# Patient Record
Sex: Female | Born: 1955 | Race: White | Hispanic: No | Marital: Married | Smoking: Never smoker
Health system: Southern US, Community
[De-identification: ages and names within clinical notes are randomized; demographics above are authoritative.]

## PROBLEM LIST (undated history)

## (undated) DIAGNOSIS — IMO0001 Reserved for inherently not codable concepts without codable children: Secondary | ICD-10-CM

## (undated) DIAGNOSIS — Z9889 Other specified postprocedural states: Secondary | ICD-10-CM

## (undated) DIAGNOSIS — K219 Gastro-esophageal reflux disease without esophagitis: Secondary | ICD-10-CM

## (undated) DIAGNOSIS — R112 Nausea with vomiting, unspecified: Secondary | ICD-10-CM

## (undated) HISTORY — PX: OTHER SURGICAL HISTORY: SHX169

---

## 2005-09-21 ENCOUNTER — Ambulatory Visit: Payer: Self-pay

## 2010-10-17 ENCOUNTER — Ambulatory Visit: Payer: Self-pay | Admitting: Internal Medicine

## 2010-11-01 ENCOUNTER — Ambulatory Visit: Payer: Self-pay | Admitting: Physician Assistant

## 2010-11-22 ENCOUNTER — Ambulatory Visit: Payer: Self-pay | Admitting: Orthopedic Surgery

## 2011-02-08 ENCOUNTER — Ambulatory Visit: Payer: Self-pay | Admitting: Orthopedic Surgery

## 2015-05-05 ENCOUNTER — Other Ambulatory Visit: Payer: Self-pay | Admitting: Orthopedic Surgery

## 2015-05-05 DIAGNOSIS — M25562 Pain in left knee: Secondary | ICD-10-CM

## 2015-05-11 ENCOUNTER — Encounter
Admission: RE | Disposition: A | Discharge status: Home / Self Care | Payer: Self-pay | Source: Ambulatory Visit | Attending: Orthopedic Surgery

## 2015-05-11 ENCOUNTER — Ambulatory Visit
Admission: RE | Admit: 2015-05-11 | Discharge: 2015-05-11 | Disposition: A | Discharge status: Home / Self Care | Payer: 59 | Source: Ambulatory Visit | Attending: Orthopedic Surgery | Admitting: Orthopedic Surgery

## 2015-05-11 ENCOUNTER — Encounter: Payer: Self-pay | Admitting: *Deleted

## 2015-05-11 ENCOUNTER — Ambulatory Visit: Payer: 59 | Admitting: Anesthesiology

## 2015-05-11 DIAGNOSIS — S83212A Bucket-handle tear of medial meniscus, current injury, left knee, initial encounter: Secondary | ICD-10-CM | POA: Diagnosis not present

## 2015-05-11 DIAGNOSIS — K219 Gastro-esophageal reflux disease without esophagitis: Secondary | ICD-10-CM | POA: Diagnosis not present

## 2015-05-11 DIAGNOSIS — X58XXXA Exposure to other specified factors, initial encounter: Secondary | ICD-10-CM | POA: Insufficient documentation

## 2015-05-11 DIAGNOSIS — Z79899 Other long term (current) drug therapy: Secondary | ICD-10-CM | POA: Insufficient documentation

## 2015-05-11 HISTORY — DX: Reserved for inherently not codable concepts without codable children: IMO0001

## 2015-05-11 HISTORY — DX: Gastro-esophageal reflux disease without esophagitis: K21.9

## 2015-05-11 HISTORY — PX: KNEE ARTHROSCOPY: SHX127

## 2015-05-11 SURGERY — ARTHROSCOPY, KNEE
Anesthesia: General | Site: Knee | Laterality: Left | Wound class: Clean

## 2015-05-11 MED ORDER — BUPIVACAINE-EPINEPHRINE (PF) 0.5% -1:200000 IJ SOLN
INTRAMUSCULAR | Status: DC | PRN
  Administered 2015-05-11: 30 mL

## 2015-05-11 MED ORDER — MIDAZOLAM HCL 2 MG/2ML IJ SOLN
INTRAMUSCULAR | Status: DC | PRN
  Administered 2015-05-11: 2 mg via INTRAVENOUS

## 2015-05-11 MED ORDER — ONDANSETRON HCL 4 MG/2ML IJ SOLN
INTRAMUSCULAR | Status: DC | PRN
  Administered 2015-05-11: 4 mg via INTRAVENOUS

## 2015-05-11 MED ORDER — ONDANSETRON HCL 4 MG/2ML IJ SOLN: 4.0000 mg | Freq: Once | INTRAMUSCULAR | Status: DC | PRN

## 2015-05-11 MED ORDER — FENTANYL CITRATE (PF) 100 MCG/2ML IJ SOLN
25.0000 ug | INTRAMUSCULAR | Status: DC | PRN
  Administered 2015-05-11 (×4): 25 ug via INTRAVENOUS

## 2015-05-11 MED ORDER — FENTANYL CITRATE (PF) 100 MCG/2ML IJ SOLN
INTRAMUSCULAR | Status: AC
  Filled 2015-05-11: qty 2

## 2015-05-11 MED ORDER — FENTANYL CITRATE (PF) 100 MCG/2ML IJ SOLN
INTRAMUSCULAR | Status: DC
Start: 2015-05-11 — End: 2015-05-11
  Filled 2015-05-11: qty 2

## 2015-05-11 MED ORDER — LIDOCAINE HCL (CARDIAC) 20 MG/ML IV SOLN
INTRAVENOUS | Status: DC | PRN
  Administered 2015-05-11: 100 mg via INTRAVENOUS

## 2015-05-11 MED ORDER — BUPIVACAINE-EPINEPHRINE (PF) 0.5% -1:200000 IJ SOLN
INTRAMUSCULAR | Status: AC
  Filled 2015-05-11: qty 30

## 2015-05-11 MED ORDER — FENTANYL CITRATE (PF) 100 MCG/2ML IJ SOLN
INTRAMUSCULAR | Status: DC | PRN
  Administered 2015-05-11: 100 ug via INTRAVENOUS

## 2015-05-11 MED ORDER — LACTATED RINGERS IV SOLN
INTRAVENOUS | Status: DC
  Administered 2015-05-11 (×2): via INTRAVENOUS

## 2015-05-11 MED ORDER — PROPOFOL 10 MG/ML IV BOLUS
INTRAVENOUS | Status: DC | PRN
  Administered 2015-05-11: 160 mg via INTRAVENOUS

## 2015-05-11 MED ORDER — HYDROCODONE-ACETAMINOPHEN 5-325 MG PO TABS: 1.0000 | ORAL_TABLET | Freq: Four times a day (QID) | ORAL | Status: DC | PRN

## 2015-05-11 SURGICAL SUPPLY — 25 items
BANDAGE ACE 4X5 VEL STRL LF (GAUZE/BANDAGES/DRESSINGS) ×2 IMPLANT
BANDAGE ELASTIC 4 LF NS (GAUZE/BANDAGES/DRESSINGS) ×2 IMPLANT
BLADE FULL RADIUS 3.5 (BLADE) IMPLANT
BLADE INCISOR PLUS 4.5 (BLADE) IMPLANT
BLADE SHAVER 4.5 DBL SERAT CV (CUTTER) IMPLANT
BLADE SHAVER 4.5X7 STR FR (MISCELLANEOUS) IMPLANT
CHLORAPREP W/TINT 26ML (MISCELLANEOUS) ×2 IMPLANT
CUTTER AGGRESSIVE+ 3.5 (CUTTER) ×2 IMPLANT
GAUZE SPONGE 4X4 12PLY STRL (GAUZE/BANDAGES/DRESSINGS) ×2 IMPLANT
GLOVE SURG ORTHO 9.0 STRL STRW (GLOVE) ×2 IMPLANT
GOWN STRL REUS W/ TWL LRG LVL3 (GOWN DISPOSABLE) ×1 IMPLANT
GOWN STRL REUS W/TWL LRG LVL3 (GOWN DISPOSABLE) ×1
GOWN SURG XXL (GOWNS) ×2 IMPLANT
IV LACTATED RINGER IRRG 3000ML (IV SOLUTION) ×4
IV LR IRRIG 3000ML ARTHROMATIC (IV SOLUTION) ×4 IMPLANT
KIT RM TURNOVER STRD PROC AR (KITS) ×2 IMPLANT
MANIFOLD NEPTUNE II (INSTRUMENTS) ×2 IMPLANT
PACK ARTHROSCOPY KNEE (MISCELLANEOUS) ×2 IMPLANT
SET TUBE SUCT SHAVER OUTFL 24K (TUBING) ×2 IMPLANT
SET TUBE TIP INTRA-ARTICULAR (MISCELLANEOUS) ×2 IMPLANT
SUT ETHILON 4-0 (SUTURE) ×1
SUT ETHILON 4-0 FS2 18XMFL BLK (SUTURE) ×1
SUTURE ETHLN 4-0 FS2 18XMF BLK (SUTURE) ×1 IMPLANT
TUBING ARTHRO INFLOW-ONLY STRL (TUBING) ×2 IMPLANT
WAND HAND CNTRL MULTIVAC 50 (MISCELLANEOUS) ×2 IMPLANT

## 2015-05-11 NOTE — Transfer of Care (Signed)
Immediate Anesthesia Transfer of Care Note  Patient: Michele ReamerCynthia Ridolfi  Procedure(s) Performed: Procedure(s): ARTHROSCOPY KNEE, PARTIAL MEDIAL MENISECTOMY (Left)  Patient Location: PACU  Anesthesia Type:General  Level of Consciousness: sedated  Airway & Oxygen Therapy: Patient Spontanous Breathing and Patient connected to face mask oxygen  Post-op Assessment: Report given to RN and Post -op Vital signs reviewed and stable  Post vital signs: Reviewed and stable  Last Vitals:  Filed Vitals:   05/11/15 1135  BP: 133/82  Pulse: 83  Temp: 36.8 C  Resp: 16    Complications: No apparent anesthesia complications

## 2015-05-11 NOTE — Anesthesia Procedure Notes (Signed)
Procedure Name: LMA Insertion Date/Time: 05/11/2015 12:51 PM Performed by: Junious SilkNOLES, Michele Boehm Pre-anesthesia Checklist: Patient identified, Patient being monitored, Timeout performed, Emergency Drugs available and Suction available Patient Re-evaluated:Patient Re-evaluated prior to inductionOxygen Delivery Method: Circle system utilized Preoxygenation: Pre-oxygenation with 100% oxygen Intubation Type: IV induction Ventilation: Mask ventilation without difficulty LMA: LMA inserted LMA Size: 3.5 Tube type: Oral Number of attempts: 1 Placement Confirmation: positive ETCO2 and breath sounds checked- equal and bilateral Tube secured with: Tape Dental Injury: Teeth and Oropharynx as per pre-operative assessment

## 2015-05-11 NOTE — Anesthesia Preprocedure Evaluation (Addendum)
Anesthesia Evaluation  Patient identified by MRN, date of birth, ID band Patient awake    Reviewed: Allergy & Precautions, NPO status , Patient's Chart, lab work & pertinent test results  Airway Mallampati: III  TM Distance: <3 FB Neck ROM: Full  Mouth opening: Limited Mouth Opening  Dental  (+) Missing, Poor Dentition   Pulmonary neg pulmonary ROS,    Pulmonary exam normal        Cardiovascular negative cardio ROS Normal cardiovascular exam     Neuro/Psych negative neurological ROS  negative psych ROS   GI/Hepatic Neg liver ROS, GERD  Medicated and Controlled,  Endo/Other  negative endocrine ROS  Renal/GU negative Renal ROS  negative genitourinary   Musculoskeletal negative musculoskeletal ROS (+)   Abdominal Normal abdominal exam  (+)   Peds negative pediatric ROS (+)  Hematology negative hematology ROS (+)   Anesthesia Other Findings   Reproductive/Obstetrics negative OB ROS                            Anesthesia Physical Anesthesia Plan  ASA: II  Anesthesia Plan: General   Post-op Pain Management:    Induction: Intravenous  Airway Management Planned: LMA  Additional Equipment:   Intra-op Plan:   Post-operative Plan: Extubation in OR  Informed Consent: I have reviewed the patients History and Physical, chart, labs and discussed the procedure including the risks, benefits and alternatives for the proposed anesthesia with the patient or authorized representative who has indicated his/her understanding and acceptance.   Dental advisory given  Plan Discussed with: CRNA and Surgeon  Anesthesia Plan Comments:         Anesthesia Quick Evaluation

## 2015-05-11 NOTE — Discharge Instructions (Addendum)
Leave dressing in place. Weightbearing as tolerated. Try to straighten leg. Aspirin 81 mg daily until walking normally, ice to the knee tonight.  General Anesthesia, Adult General anesthesia is a sleep-like state of non-feeling produced by medicines (anesthetics). General anesthesia prevents you from being alert and feeling pain during a medical procedure. Your caregiver may recommend general anesthesia if your procedure:  Is long.  Is painful or uncomfortable.  Would be frightening to see or hear.  Requires you to be still.  Affects your breathing.  Causes significant blood loss. LET YOUR CAREGIVER KNOW ABOUT:  Allergies to food or medicine.  Medicines taken, including vitamins, herbs, eyedrops, over-the-counter medicines, and creams.  Use of steroids (by mouth or creams).  Previous problems with anesthetics or numbing medicines, including problems experienced by relatives.  History of bleeding problems or blood clots.  Previous surgeries and types of anesthetics received.  Possibility of pregnancy, if this applies.  Use of cigarettes, alcohol, or illegal drugs.  Any health condition(s), especially diabetes, sleep apnea, and high blood pressure. RISKS AND COMPLICATIONS General anesthesia rarely causes complications. However, if complications do occur, they can be life threatening. Complications include:  A lung infection.  A stroke.  A heart attack.  Waking up during the procedure. When this occurs, the patient may be unable to move and communicate that he or she is awake. The patient may feel severe pain. Older adults and adults with serious medical problems are more likely to have complications than adults who are young and healthy. Some complications can be prevented by answering all of your caregiver's questions thoroughly and by following all pre-procedure instructions. It is important to tell your caregiver if any of the pre-procedure instructions, especially those  related to diet, were not followed. Any food or liquid in the stomach can cause problems when you are under general anesthesia. BEFORE THE PROCEDURE  Ask your caregiver if you will have to spend the night at the hospital. If you will not have to spend the night, arrange to have an adult drive you and stay with you for 24 hours.  Follow your caregiver's instructions if you are taking dietary supplements or medicines. Your caregiver may tell you to stop taking them or to reduce your dosage.  Do not smoke for as long as possible before your procedure. If possible, stop smoking 3-6 weeks before the procedure.  Do not take new dietary supplements or medicines within 1 week of your procedure unless your caregiver approves them.  Do not eat within 8 hours of your procedure or as directed by your caregiver. Drink only clear liquids, such as water, black coffee (without milk or cream), and fruit juices (without pulp).  Do not drink within 3 hours of your procedure or as directed by your caregiver.  You may brush your teeth on the morning of the procedure, but make sure to spit out the toothpaste and water when finished. PROCEDURE  You will receive anesthetics through a mask, through an intravenous (IV) access tube, or through both. A doctor who specializes in anesthesia (anesthesiologist) or a nurse who specializes in anesthesia (nurse anesthetist) or both will stay with you throughout the procedure to make sure you remain unconscious. He or she will also watch your blood pressure, pulse, and oxygen levels to make sure that the anesthetics do not cause any problems. Once you are asleep, a breathing tube or mask may be used to help you breathe. AFTER THE PROCEDURE You will wake up after the procedure  is complete. You may be in the room where the procedure was performed or in a recovery area. You may have a sore throat if a breathing tube was used. You may also  feel:  Dizzy.  Weak.  Drowsy.  Confused.  Nauseous.  Cold. These are all normal responses and can be expected to last for up to 24 hours after the procedure is complete. A caregiver will tell you when you are ready to go home. This will usually be when you are fully awake and in stable condition.   This information is not intended to replace advice given to you by your health care provider. Make sure you discuss any questions you have with your health care provider.   Document Released: 04/18/2007 Document Revised: 01/30/2014 Document Reviewed: 05/10/2011 Elsevier Interactive Patient Education [employer]2016 [employer]

## 2015-05-11 NOTE — H&P (Signed)
Reviewed paper H+P, will be scanned into chart. No changes noted.  

## 2015-05-12 ENCOUNTER — Encounter: Payer: Self-pay | Admitting: Orthopedic Surgery

## 2015-05-12 NOTE — Op Note (Signed)
05/11/2015  5:22 PM  PATIENT:  Michele Melton  60 y.o. female  PRE-OPERATIVE DIAGNOSIS:  medial meniscus left knee  POST-OPERATIVE DIAGNOSIS:  MEDIAL MENISCUS TEAR  PROCEDURE:  Procedure(s): ARTHROSCOPY KNEE, PARTIAL MEDIAL MENISECTOMY (Left)  SURGEON: Leitha SchullerMichael J Charniece Venturino, MD  ASSISTANTS: None  ANESTHESIA:   general  EBL:  Total I/O In: 1100 [P.O.:200; I.V.:900] Out: 5 [Blood:5]  BLOOD ADMINISTERED:none  DRAINS: none   LOCAL MEDICATIONS USED:  MARCAINE     SPECIMEN:  No Specimen  DISPOSITION OF SPECIMEN:  N/A  COUNTS:  YES  TOURNIQUET:    IMPLANTS: None  DICTATION: .Dragon Dictation patient was brought to the operating room and after adequate anesthesia was obtained the leg was placed in arthroscopic leg holder. After patient identification and timeout procedure completed after having prepped and draped the leg in inferior lateral portal was made. Initial inspection revealed mild patellofemoral degenerative change with normal tracking. There was a retained plica in the suprapatellar pouch which was debrided to make sure there is no loose body other potential causes of pain in the superior gutters. Coming around medially and inferior medial portal was made there is extensive degenerative change with chondral flaps present on the medial femoral condyle. There is some fissuring but no exposed bone. On probing there was a tear of the posterior horn of the medial meniscus involving the central third of the posterior third this was subsequently debrided with an ArthroCare wand and meniscal punch. The anterior cruciate ligament was intact and lateral compartment was essentially normal with just a few small areas of articular damage. The gutters were free of any loose body with mild synovitis. After addressing the medial meniscal pathology and removing the plica superiorly the knee was thoroughly irrigated until clear and all instrumentation withdrawn. Pre-and post procedure pictures have  been obtained. The wounds were closed with simple 4-0 nylon with 20 cc of half percent Sensorcaine infiltrated near the portals. Xeroform 4 x 4 web roll and Ace wrap applied  PLAN OF CARE: Discharge to home after PACU  PATIENT DISPOSITION:  PACU - hemodynamically stable.

## 2015-05-12 NOTE — Anesthesia Postprocedure Evaluation (Signed)
Anesthesia Post Note  Patient: Michele ReamerCynthia Melton  Procedure(s) Performed: Procedure(s) (LRB): ARTHROSCOPY KNEE, PARTIAL MEDIAL MENISECTOMY (Left)  Patient location during evaluation: PACU Anesthesia Type: General Level of consciousness: awake and alert and oriented Pain management: pain level controlled Vital Signs Assessment: post-procedure vital signs reviewed and stable Respiratory status: spontaneous breathing Cardiovascular status: blood pressure returned to baseline Anesthetic complications: no    Last Vitals:  Filed Vitals:   05/11/15 1430 05/11/15 1515  BP: 129/66 120/95  Pulse: 73 63  Temp: 36.5 C   Resp: 18 18    Last Pain:  Filed Vitals:   05/12/15 0817  PainSc: 0-No pain                 Lindwood Mogel

## 2015-05-14 ENCOUNTER — Ambulatory Visit: Payer: Self-pay

## 2018-02-26 ENCOUNTER — Other Ambulatory Visit: Payer: Self-pay

## 2018-02-26 ENCOUNTER — Encounter: Payer: Self-pay | Admitting: *Deleted

## 2018-02-27 ENCOUNTER — Encounter: Payer: Self-pay | Admitting: Anesthesiology

## 2018-02-27 NOTE — Discharge Instructions (Signed)

## 2018-03-06 ENCOUNTER — Ambulatory Visit
Admission: RE | Admit: 2018-03-06 | Discharge: 2018-03-06 | Disposition: A | Discharge status: Home / Self Care | Payer: 59 | Source: Ambulatory Visit | Attending: Ophthalmology | Admitting: Ophthalmology

## 2018-03-06 ENCOUNTER — Encounter
Admission: RE | Disposition: A | Discharge status: Home / Self Care | Payer: Self-pay | Source: Ambulatory Visit | Attending: Ophthalmology

## 2018-03-06 ENCOUNTER — Ambulatory Visit: Payer: 59 | Admitting: Anesthesiology

## 2018-03-06 DIAGNOSIS — H2512 Age-related nuclear cataract, left eye: Secondary | ICD-10-CM | POA: Diagnosis present

## 2018-03-06 DIAGNOSIS — K219 Gastro-esophageal reflux disease without esophagitis: Secondary | ICD-10-CM | POA: Insufficient documentation

## 2018-03-06 DIAGNOSIS — Z79899 Other long term (current) drug therapy: Secondary | ICD-10-CM | POA: Diagnosis not present

## 2018-03-06 DIAGNOSIS — M1990 Unspecified osteoarthritis, unspecified site: Secondary | ICD-10-CM | POA: Insufficient documentation

## 2018-03-06 HISTORY — DX: Nausea with vomiting, unspecified: R11.2

## 2018-03-06 HISTORY — DX: Other specified postprocedural states: Z98.890

## 2018-03-06 HISTORY — DX: Gastro-esophageal reflux disease without esophagitis: K21.9

## 2018-03-06 HISTORY — PX: CATARACT EXTRACTION W/PHACO: SHX586

## 2018-03-06 SURGERY — PHACOEMULSIFICATION, CATARACT, WITH IOL INSERTION
Anesthesia: Monitor Anesthesia Care | Site: Eye | Laterality: Right

## 2018-03-06 MED ORDER — MOXIFLOXACIN HCL 0.5 % OP SOLN
1.0000 [drp] | OPHTHALMIC | Status: DC | PRN
  Administered 2018-03-06 (×3): 1 [drp] via OPHTHALMIC

## 2018-03-06 MED ORDER — FENTANYL CITRATE (PF) 100 MCG/2ML IJ SOLN
INTRAMUSCULAR | Status: DC | PRN
  Administered 2018-03-06: 50 ug via INTRAVENOUS

## 2018-03-06 MED ORDER — ONDANSETRON HCL 4 MG/2ML IJ SOLN
INTRAMUSCULAR | Status: DC | PRN
  Administered 2018-03-06: 4 mg via INTRAVENOUS

## 2018-03-06 MED ORDER — EPINEPHRINE PF 1 MG/ML IJ SOLN
INTRAOCULAR | Status: DC | PRN
  Administered 2018-03-06: 69 mL via OPHTHALMIC

## 2018-03-06 MED ORDER — ARMC OPHTHALMIC DILATING DROPS
1.0000 "application " | OPHTHALMIC | Status: DC | PRN
  Administered 2018-03-06 (×3): 1 via OPHTHALMIC

## 2018-03-06 MED ORDER — CEFUROXIME OPHTHALMIC INJECTION 1 MG/0.1 ML
INJECTION | OPHTHALMIC | Status: DC | PRN
  Administered 2018-03-06: 0.1 mL via INTRACAMERAL

## 2018-03-06 MED ORDER — NA HYALUR & NA CHOND-NA HYALUR 0.4-0.35 ML IO KIT
PACK | INTRAOCULAR | Status: DC | PRN
  Administered 2018-03-06: 1 mL via INTRAOCULAR

## 2018-03-06 MED ORDER — BRIMONIDINE TARTRATE-TIMOLOL 0.2-0.5 % OP SOLN
OPHTHALMIC | Status: DC | PRN
  Administered 2018-03-06: 1 [drp] via OPHTHALMIC

## 2018-03-06 MED ORDER — LIDOCAINE HCL (PF) 2 % IJ SOLN
INTRAOCULAR | Status: DC | PRN
  Administered 2018-03-06: 1 mL

## 2018-03-06 MED ORDER — TETRACAINE HCL 0.5 % OP SOLN
1.0000 [drp] | OPHTHALMIC | Status: DC | PRN
  Administered 2018-03-06 (×3): 1 [drp] via OPHTHALMIC

## 2018-03-06 MED ORDER — MIDAZOLAM HCL 2 MG/2ML IJ SOLN
INTRAMUSCULAR | Status: DC | PRN
  Administered 2018-03-06 (×2): 1 mg via INTRAVENOUS

## 2018-03-06 MED ORDER — OXYCODONE HCL 5 MG/5ML PO SOLN: 5.0000 mg | Freq: Once | ORAL | Status: DC | PRN

## 2018-03-06 MED ORDER — OXYCODONE HCL 5 MG PO TABS: 5.0000 mg | ORAL_TABLET | Freq: Once | ORAL | Status: DC | PRN

## 2018-03-06 SURGICAL SUPPLY — 22 items
CANNULA ANT/CHMB 27G (MISCELLANEOUS) ×1 IMPLANT
CANNULA ANT/CHMB 27GA (MISCELLANEOUS) ×3 IMPLANT
GLOVE SURG LX 7.5 STRW (GLOVE) ×2
GLOVE SURG LX STRL 7.5 STRW (GLOVE) ×1 IMPLANT
GLOVE SURG TRIUMPH 8.0 PF LTX (GLOVE) ×3 IMPLANT
GOWN STRL REUS W/ TWL LRG LVL3 (GOWN DISPOSABLE) ×2 IMPLANT
GOWN STRL REUS W/TWL LRG LVL3 (GOWN DISPOSABLE) ×4
LENS IOL ACRSF IQ TRC 3 14.5 IMPLANT
LENS IOL ACRYSOF IQ TORIC 14.5 ×2 IMPLANT
LENS IOL IQ TORIC 3 14.5 ×1 IMPLANT
MARKER SKIN DUAL TIP RULER LAB (MISCELLANEOUS) ×3 IMPLANT
NDL FILTER BLUNT 18X1 1/2 (NEEDLE) ×1 IMPLANT
NEEDLE FILTER BLUNT 18X 1/2SAF (NEEDLE) ×2
NEEDLE FILTER BLUNT 18X1 1/2 (NEEDLE) ×1 IMPLANT
PACK CATARACT BRASINGTON (MISCELLANEOUS) ×3 IMPLANT
PACK EYE AFTER SURG (MISCELLANEOUS) ×3 IMPLANT
PACK OPTHALMIC (MISCELLANEOUS) ×3 IMPLANT
SYR 3ML LL SCALE MARK (SYRINGE) ×3 IMPLANT
SYR 5ML LL (SYRINGE) ×3 IMPLANT
SYR TB 1ML LUER SLIP (SYRINGE) ×3 IMPLANT
WATER STERILE IRR 500ML POUR (IV SOLUTION) ×3 IMPLANT
WIPE NON LINTING 3.25X3.25 (MISCELLANEOUS) ×3 IMPLANT

## 2018-03-06 NOTE — Op Note (Addendum)
LOCATION:  [facility]   PREOPERATIVE DIAGNOSIS:  Nuclear sclerotic cataract of the left eye.  H25.12  POSTOPERATIVE DIAGNOSIS:  Nuclear sclerotic cataract of the left eye.   PROCEDURE:  Phacoemulsification with Toric posterior chamber intraocular lens placement of the left eye.   LENS:  Implant Name Type Inv. Item Serial No. Manufacturer Lot No. LRB No. Used  AcrySof IQ Toric astigmatism iol Intraocular Lens  [id number]   Right 1   SN6aT3 14.5D Toric intraocular lens with 1.5 diopters of cylindrical power with axis orientation at 94 degrees.   ULTRASOUND TIME: 14 % of 0 minutes, 56 seconds.  CDE 8.2   SURGEON:  Deirdre Evener, MD   ANESTHESIA:  Topical with tetracaine drops and 2% Xylocaine jelly, augmented with 1% preservative-free intracameral lidocaine.  COMPLICATIONS:  None.   DESCRIPTION OF PROCEDURE:  The patient was identified in the holding room and transported to the operating suite and placed in the supine position under the operating microscope.  The left eye was identified as the operative eye, and it was prepped and draped in the usual sterile ophthalmic fashion.    A clear-corneal paracentesis incision was made at the 1:30 position.  0.5 ml of preservative-free 1% lidocaine was injected into the anterior chamber. The anterior chamber was filled with Viscoat.  A 2.4 millimeter near clear corneal incision was then made at the 10:30 position.  A cystotome and capsulorrhexis forceps were then used to make a curvilinear capsulorrhexis.  Hydrodissection and hydrodelineation were then performed using balanced salt solution.   Phacoemulsification was then used in stop and chop fashion to remove the lens, nucleus and epinucleus.  The remaining cortex was aspirated using the irrigation and aspiration handpiece.  Provisc viscoelastic was then placed into the capsular bag to distend it for lens placement.  The Verion digital marker was used to align the implant at the  intended axis.   A 14.5 diopter lens was then injected into the capsular bag.  It was rotated clockwise until the axis marks on the lens were approximately 15 degrees in the counterclockwise direction to the intended alignment.  The viscoelastic was aspirated from the eye using the irrigation aspiration handpiece.  Then, a Koch spatula through the sideport incision was used to rotate the lens in a clockwise direction until the axis markings of the intraocular lens were lined up with the Verion alignment.  Balanced salt solution was then used to hydrate the wounds. Cefuroxime 0.1 ml of a 10mg /ml solution was injected into the anterior chamber for a dose of 1 mg of intracameral antibiotic at the completion of the case.    The eye was noted to have a physiologic pressure and there was no wound leak noted.   Timolol and Brimonidine drops were applied to the eye.  The patient was taken to the recovery room in stable condition having had no complications of anesthesia or surgery.  Michele Melton 03/06/2018, 9:42 AM

## 2018-03-06 NOTE — Anesthesia Postprocedure Evaluation (Signed)
Anesthesia Post Note  Patient: Michele Melton  Procedure(s) Performed: CATARACT EXTRACTION PHACO AND INTRAOCULAR LENS PLACEMENT ([place]) RIGHT (Right Eye)  Patient location during evaluation: PACU Anesthesia Type: MAC Level of consciousness: awake and alert Pain management: pain level controlled Vital Signs Assessment: post-procedure vital signs reviewed and stable Respiratory status: spontaneous breathing, nonlabored ventilation, respiratory function stable and patient connected to nasal cannula oxygen Cardiovascular status: stable and blood pressure returned to baseline Postop Assessment: no apparent nausea or vomiting Anesthetic complications: no    Antoinett Dorman

## 2018-03-06 NOTE — Transfer of Care (Signed)
Immediate Anesthesia Transfer of Care Note  Patient: Michele Melton  Procedure(s) Performed: CATARACT EXTRACTION PHACO AND INTRAOCULAR LENS PLACEMENT ([facility]) RIGHT (Right Eye)  Patient Location: PACU  Anesthesia Type: MAC  Level of Consciousness: awake, alert  and patient cooperative  Airway and Oxygen Therapy: Patient Spontanous Breathing and Patient connected to supplemental oxygen  Post-op Assessment: Post-op Vital signs reviewed, Patient's Cardiovascular Status Stable, Respiratory Function Stable, Patent Airway and No signs of Nausea or vomiting  Post-op Vital Signs: Reviewed and stable  Complications: No apparent anesthesia complications

## 2018-03-06 NOTE — Anesthesia Preprocedure Evaluation (Signed)
Anesthesia Evaluation  Patient identified by MRN, date of birth, ID band  Reviewed: NPO status   History of Anesthesia Complications (+) PONV and history of anesthetic complications  Airway Mallampati: II  TM Distance: >3 FB Neck ROM: full    Dental  (+) Missing,    Pulmonary neg pulmonary ROS,    Pulmonary exam normal        Cardiovascular Exercise Tolerance: Good negative cardio ROS Normal cardiovascular exam     Neuro/Psych negative neurological ROS  negative psych ROS   GI/Hepatic Neg liver ROS, GERD  Controlled,  Endo/Other  negative endocrine ROS  Renal/GU negative Renal ROS  negative genitourinary   Musculoskeletal   Abdominal   Peds  Hematology negative hematology ROS (+)   Anesthesia Other Findings   Reproductive/Obstetrics                             Anesthesia Physical Anesthesia Plan  ASA: II  Anesthesia Plan: MAC   Post-op Pain Management:    Induction:   PONV Risk Score and Plan:   Airway Management Planned:   Additional Equipment:   Intra-op Plan:   Post-operative Plan:   Informed Consent: I have reviewed the patients History and Physical, chart, labs and discussed the procedure including the risks, benefits and alternatives for the proposed anesthesia with the patient or authorized representative who has indicated his/her understanding and acceptance.       Plan Discussed with: CRNA  Anesthesia Plan Comments:         Anesthesia Quick Evaluation

## 2018-03-06 NOTE — H&P (Signed)

## 2018-03-07 ENCOUNTER — Encounter: Payer: Self-pay | Admitting: Ophthalmology

## 2018-03-18 ENCOUNTER — Encounter: Payer: Self-pay | Admitting: *Deleted

## 2018-03-20 ENCOUNTER — Ambulatory Visit: Payer: 59 | Admitting: Anesthesiology

## 2018-03-20 ENCOUNTER — Encounter
Admission: RE | Disposition: A | Discharge status: Home / Self Care | Payer: Self-pay | Source: Home / Self Care | Attending: Ophthalmology

## 2018-03-20 ENCOUNTER — Ambulatory Visit
Admission: RE | Admit: 2018-03-20 | Discharge: 2018-03-20 | Disposition: A | Discharge status: Home / Self Care | Payer: 59 | Attending: Ophthalmology | Admitting: Ophthalmology

## 2018-03-20 DIAGNOSIS — H2512 Age-related nuclear cataract, left eye: Secondary | ICD-10-CM | POA: Insufficient documentation

## 2018-03-20 DIAGNOSIS — Z9841 Cataract extraction status, right eye: Secondary | ICD-10-CM | POA: Diagnosis not present

## 2018-03-20 DIAGNOSIS — K219 Gastro-esophageal reflux disease without esophagitis: Secondary | ICD-10-CM | POA: Insufficient documentation

## 2018-03-20 HISTORY — PX: CATARACT EXTRACTION W/PHACO: SHX586

## 2018-03-20 SURGERY — PHACOEMULSIFICATION, CATARACT, WITH IOL INSERTION
Anesthesia: Monitor Anesthesia Care | Site: Eye | Laterality: Left

## 2018-03-20 MED ORDER — CARBACHOL 0.01 % IO SOLN
INTRAOCULAR | Status: DC | PRN
  Administered 2018-03-20: .5 mL via INTRAOCULAR

## 2018-03-20 MED ORDER — MOXIFLOXACIN HCL 0.5 % OP SOLN
1.0000 [drp] | OPHTHALMIC | Status: DC | PRN
  Administered 2018-03-20: 1 [drp] via OPHTHALMIC

## 2018-03-20 MED ORDER — POVIDONE-IODINE 5 % OP SOLN
OPHTHALMIC | Status: DC | PRN
  Administered 2018-03-20: 1 via OPHTHALMIC

## 2018-03-20 MED ORDER — FENTANYL CITRATE (PF) 100 MCG/2ML IJ SOLN
INTRAMUSCULAR | Status: DC | PRN
  Administered 2018-03-20 (×2): 50 ug via INTRAVENOUS

## 2018-03-20 MED ORDER — LIDOCAINE HCL (PF) 4 % IJ SOLN
INTRAOCULAR | Status: DC | PRN
  Administered 2018-03-20: 2 mL via OPHTHALMIC

## 2018-03-20 MED ORDER — FENTANYL CITRATE (PF) 100 MCG/2ML IJ SOLN
INTRAMUSCULAR | Status: AC
  Filled 2018-03-20: qty 2

## 2018-03-20 MED ORDER — LIDOCAINE HCL (PF) 4 % IJ SOLN
INTRAMUSCULAR | Status: AC
  Filled 2018-03-20: qty 5

## 2018-03-20 MED ORDER — EPINEPHRINE PF 1 MG/ML IJ SOLN
INTRAMUSCULAR | Status: AC
  Filled 2018-03-20: qty 1

## 2018-03-20 MED ORDER — NEOMYCIN-POLYMYXIN-DEXAMETH 0.1 % OP OINT
TOPICAL_OINTMENT | OPHTHALMIC | Status: DC | PRN
  Administered 2018-03-20: 1 via OPHTHALMIC

## 2018-03-20 MED ORDER — ARMC OPHTHALMIC DILATING DROPS
1.0000 "application " | OPHTHALMIC | Status: AC
  Administered 2018-03-20 (×3): 1 via OPHTHALMIC

## 2018-03-20 MED ORDER — NEOMYCIN-POLYMYXIN-DEXAMETH 3.5-10000-0.1 OP OINT
TOPICAL_OINTMENT | OPHTHALMIC | Status: AC
  Filled 2018-03-20: qty 3.5

## 2018-03-20 MED ORDER — TETRACAINE HCL 0.5 % OP SOLN
1.0000 [drp] | OPHTHALMIC | Status: AC | PRN
  Administered 2018-03-20 (×2): 1 [drp] via OPHTHALMIC

## 2018-03-20 MED ORDER — ARMC OPHTHALMIC DILATING DROPS
OPHTHALMIC | Status: AC
  Administered 2018-03-20: 1 via OPHTHALMIC
  Filled 2018-03-20: qty 0.5

## 2018-03-20 MED ORDER — NA HYALUR & NA CHOND-NA HYALUR 0.4-0.35 ML IO KIT
PACK | INTRAOCULAR | Status: DC | PRN
  Administered 2018-03-20: 1 mL via INTRAOCULAR

## 2018-03-20 MED ORDER — POVIDONE-IODINE 5 % OP SOLN
OPHTHALMIC | Status: AC
  Filled 2018-03-20: qty 30

## 2018-03-20 MED ORDER — MOXIFLOXACIN HCL 0.5 % OP SOLN
OPHTHALMIC | Status: AC
  Filled 2018-03-20: qty 6

## 2018-03-20 MED ORDER — SODIUM CHLORIDE 0.9 % IV SOLN
INTRAVENOUS | Status: DC
  Administered 2018-03-20: 08:00:00 via INTRAVENOUS

## 2018-03-20 MED ORDER — TETRACAINE HCL 0.5 % OP SOLN
OPHTHALMIC | Status: AC
  Administered 2018-03-20: 1 [drp] via OPHTHALMIC
  Filled 2018-03-20: qty 4

## 2018-03-20 MED ORDER — MIDAZOLAM HCL 2 MG/2ML IJ SOLN
INTRAMUSCULAR | Status: DC | PRN
  Administered 2018-03-20 (×2): 1 mg via INTRAVENOUS

## 2018-03-20 MED ORDER — EPINEPHRINE PF 1 MG/ML IJ SOLN
INTRAOCULAR | Status: DC | PRN
  Administered 2018-03-20: 1 mL via OPHTHALMIC

## 2018-03-20 MED ORDER — NA HYALUR & NA CHOND-NA HYALUR 0.55-0.5 ML IO KIT
PACK | INTRAOCULAR | Status: AC
  Filled 2018-03-20: qty 1.05

## 2018-03-20 MED ORDER — MIDAZOLAM HCL 2 MG/2ML IJ SOLN
INTRAMUSCULAR | Status: AC
  Filled 2018-03-20: qty 2

## 2018-03-20 MED ORDER — MOXIFLOXACIN HCL 0.5 % OP SOLN
1.0000 [drp] | OPHTHALMIC | Status: AC
  Administered 2018-03-20 (×2): 1 [drp] via OPHTHALMIC

## 2018-03-20 SURGICAL SUPPLY — 20 items
GLOVE BIO SURGEON STRL SZ8 (GLOVE) ×3 IMPLANT
GLOVE BIOGEL M 6.5 STRL (GLOVE) ×3 IMPLANT
GLOVE SURG LX 7.5 STRW (GLOVE) ×2
GLOVE SURG LX STRL 7.5 STRW (GLOVE) ×1 IMPLANT
GOWN STRL REUS W/ TWL LRG LVL3 (GOWN DISPOSABLE) ×2 IMPLANT
GOWN STRL REUS W/TWL LRG LVL3 (GOWN DISPOSABLE) ×4
LABEL CATARACT MEDS ST (LABEL) ×3 IMPLANT
LENS IOL ACRSF IQ TRC 7 13.5 IMPLANT
LENS IOL ACRYSOF IQ TORIC 13.5 ×2 IMPLANT
LENS IOL IQ TORIC 7 13.5 ×1 IMPLANT
NDL HPO THNWL 1X22GA REG BVL (NEEDLE) ×1 IMPLANT
NEEDLE SAFETY 22GX1 (NEEDLE) ×2
PACK CATARACT (MISCELLANEOUS) ×3 IMPLANT
PACK CATARACT BRASINGTON LX (MISCELLANEOUS) ×3 IMPLANT
PACK EYE AFTER SURG (MISCELLANEOUS) ×3 IMPLANT
SOL BSS BAG (MISCELLANEOUS) ×3
SOLUTION BSS BAG (MISCELLANEOUS) ×1 IMPLANT
SYR 5ML LL (SYRINGE) ×3 IMPLANT
WATER STERILE IRR 250ML POUR (IV SOLUTION) ×3 IMPLANT
WIPE NON LINTING 3.25X3.25 (MISCELLANEOUS) ×3 IMPLANT

## 2018-03-20 NOTE — Op Note (Signed)
LOCATION:  [facility]   PREOPERATIVE DIAGNOSIS:  Nuclear sclerotic cataract of the left eye.  H25.12  POSTOPERATIVE DIAGNOSIS:  Nuclear sclerotic cataract of the left eye.   PROCEDURE:  Phacoemulsification with Toric posterior chamber intraocular lens placement of the left eye.   LENS:  Implant Name Type Inv. Item Serial No. Manufacturer Lot No. LRB No. Used  alcon acrysof toric lens   [id number]   Left 1   SN6AT7 13.5 DToric intraocular lens with 4.5 diopters of cylindrical power with axis orientation at 64 degrees.   ULTRASOUND TIME: 12 % of 1 minutes, 3 seconds.  CDE 9.5   SURGEON:  Deirdre Evener, MD   ANESTHESIA:  Topical with tetracaine drops and 2% Xylocaine jelly, augmented with 1% preservative-free intracameral lidocaine.  COMPLICATIONS:  None.   DESCRIPTION OF PROCEDURE:  The patient was identified in the holding room and transported to the operating suite and placed in the supine position under the operating microscope.  The left eye was identified as the operative eye, and it was prepped and draped in the usual sterile ophthalmic fashion.    A clear-corneal paracentesis incision was made at the 1:30 position.  0.5 ml of preservative-free 1% lidocaine was injected into the anterior chamber. The anterior chamber was filled with Viscoat.  A 2.4 millimeter near clear corneal incision was then made at the 10:30 position.  A cystotome and capsulorrhexis forceps were then used to make a curvilinear capsulorrhexis.  Hydrodissection and hydrodelineation were then performed using balanced salt solution.   Phacoemulsification was then used in stop and chop fashion to remove the lens, nucleus and epinucleus.  The remaining cortex was aspirated using the irrigation and aspiration handpiece.  Provisc viscoelastic was then placed into the capsular bag to distend it for lens placement.  The Verion digital marker was used to align the implant at the intended axis.   A 13.5  diopter lens was then injected into the capsular bag.  It was rotated clockwise until the axis marks on the lens were approximately 15 degrees in the counterclockwise direction to the intended alignment.  The viscoelastic was aspirated from the eye using the irrigation aspiration handpiece.  Then, a Koch spatula through the sideport incision was used to rotate the lens in a clockwise direction until the axis markings of the intraocular lens were lined up with the Verion alignment.  Balanced salt solution was then used to hydrate the wounds. Vigamox 0.2 ml of a 1mg  per ml solution was injected into the anterior chamber for a dose of 0.2 mg of intracameral antibiotic at the completion of the case.    The eye was noted to have a physiologic pressure and there was no wound leak noted.   Timolol and Brimonidine drops were applied to the eye.  The patient was taken to the recovery room in stable condition having had no complications of anesthesia or surgery.  Vieno Tarrant 03/20/2018, 9:15 AM

## 2018-03-20 NOTE — Anesthesia Preprocedure Evaluation (Signed)
Anesthesia Evaluation  Patient identified by MRN, date of birth, ID band Patient awake    Reviewed: Allergy & Precautions, NPO status , Patient's Chart, lab work & pertinent test results  History of Anesthesia Complications (+) PONV and history of anesthetic complications  Airway Mallampati: II  TM Distance: >3 FB Neck ROM: Full    Dental no notable dental hx.    Pulmonary neg pulmonary ROS, neg sleep apnea, neg COPD,    breath sounds clear to auscultation- rhonchi (-) wheezing      Cardiovascular Exercise Tolerance: Good (-) hypertension(-) CAD, (-) Past MI, (-) Cardiac Stents and (-) CABG  Rhythm:Regular Rate:Normal - Systolic murmurs and - Diastolic murmurs    Neuro/Psych neg Seizures negative neurological ROS  negative psych ROS   GI/Hepatic Neg liver ROS, GERD  ,  Endo/Other  negative endocrine ROSneg diabetes  Renal/GU negative Renal ROS     Musculoskeletal negative musculoskeletal ROS (+)   Abdominal (+) - obese,   Peds  Hematology negative hematology ROS (+)   Anesthesia Other Findings Past Medical History: No date: GERD (gastroesophageal reflux disease) No date: PONV (postoperative nausea and vomiting)     Comment:  after 1st knee surgery No date: Reflux   Reproductive/Obstetrics                             Anesthesia Physical Anesthesia Plan  ASA: II  Anesthesia Plan: MAC   Post-op Pain Management:    Induction: Intravenous  PONV Risk Score and Plan: 3 and Midazolam  Airway Management Planned: Natural Airway  Additional Equipment:   Intra-op Plan:   Post-operative Plan:   Informed Consent: I have reviewed the patients History and Physical, chart, labs and discussed the procedure including the risks, benefits and alternatives for the proposed anesthesia with the patient or authorized representative who has indicated his/her understanding and acceptance.        Plan Discussed with: CRNA and Anesthesiologist  Anesthesia Plan Comments:         Anesthesia Quick Evaluation

## 2018-03-20 NOTE — H&P (Signed)

## 2018-03-20 NOTE — Anesthesia Postprocedure Evaluation (Signed)
Anesthesia Post Note  Patient: Michele Melton  Procedure(s) Performed: CATARACT EXTRACTION PHACO AND INTRAOCULAR LENS PLACEMENT ([facility])-LEFT (Left Eye)  Patient location during evaluation: PACU Anesthesia Type: MAC Level of consciousness: awake and alert and oriented Pain management: pain level controlled Vital Signs Assessment: post-procedure vital signs reviewed and stable Respiratory status: spontaneous breathing, nonlabored ventilation and respiratory function stable Cardiovascular status: blood pressure returned to baseline and stable Postop Assessment: no signs of nausea or vomiting Anesthetic complications: no     Last Vitals:  Vitals:   03/20/18 0751 03/20/18 0917  BP: 127/62 114/76  Pulse:  73  Resp:  16  Temp:  36.8 C  SpO2:  98%    Last Pain:  Vitals:   03/20/18 0917  TempSrc: Oral  PainSc: 0-No pain                 Daymond Cordts

## 2018-03-20 NOTE — Anesthesia Post-op Follow-up Note (Signed)
Anesthesia QCDR form completed.        

## 2018-03-20 NOTE — Transfer of Care (Signed)
Immediate Anesthesia Transfer of Care Note  Patient: Michele Melton  Procedure(s) Performed: CATARACT EXTRACTION PHACO AND INTRAOCULAR LENS PLACEMENT ([facility])-LEFT (Left Eye)  Patient Location: PACU and Short Stay  Anesthesia Type:MAC  Level of Consciousness: awake, alert  and oriented  Airway & Oxygen Therapy: Patient Spontanous Breathing  Post-op Assessment: Report given to RN and Post -op Vital signs reviewed and stable  Post vital signs: Reviewed and stable  Last Vitals:  Vitals Value Taken Time  BP    Temp    Pulse    Resp    SpO2      Last Pain:  Vitals:   03/20/18 0718  TempSrc: Tympanic         Complications: No apparent anesthesia complications

## 2018-03-20 NOTE — Discharge Instructions (Addendum)
Eye Surgery Discharge Instructions  Expect mild scratchy sensation or mild soreness. DO NOT RUB YOUR EYE!  The day of surgery:  Minimal physical activity, but bed rest is not required  No reading, computer work, or close hand work  No bending, lifting, or straining.  May watch TV  For 24 hours:  No driving, legal decisions, or alcoholic beverages  Safety precautions  Eat anything you prefer: It is better to start with liquids, then soup then solid foods.  Solar shield eyeglasses should be worn for comfort in the sunlight/patch while sleeping  Resume all regular medications including aspirin or Coumadin if these were discontinued prior to surgery. You may shower, bathe, shave, or wash your hair. Tylenol may be taken for mild discomfort. Follow Dr. Skip Estimable eye drop instruction sheet as reviewed.  Call your doctor if you experience significant pain, nausea, or vomiting, fever > 101 or other signs of infection. [phone] or [phone] Specific instructions:  Follow-up Information    Lockie Mola, MD Follow up.   Specialty:  Ophthalmology Why:  03/21/18 @ 9:40 am Contact information: [address]   [place] [address] [phone]

## 2018-08-12 ENCOUNTER — Encounter: Payer: Self-pay | Admitting: Ophthalmology

## 2020-10-04 ENCOUNTER — Emergency Department: Payer: POS

## 2020-10-04 ENCOUNTER — Other Ambulatory Visit: Payer: Self-pay

## 2020-10-04 ENCOUNTER — Emergency Department
Admission: EM | Admit: 2020-10-04 | Discharge: 2020-10-04 | Disposition: A | Discharge status: Home / Self Care | Payer: POS | Attending: Emergency Medicine | Admitting: Emergency Medicine

## 2020-10-04 DIAGNOSIS — M5412 Radiculopathy, cervical region: Secondary | ICD-10-CM | POA: Insufficient documentation

## 2020-10-04 DIAGNOSIS — R29818 Other symptoms and signs involving the nervous system: Secondary | ICD-10-CM | POA: Diagnosis not present

## 2020-10-04 DIAGNOSIS — G589 Mononeuropathy, unspecified: Secondary | ICD-10-CM

## 2020-10-04 DIAGNOSIS — M542 Cervicalgia: Secondary | ICD-10-CM | POA: Diagnosis present

## 2020-10-04 DIAGNOSIS — R1010 Upper abdominal pain, unspecified: Secondary | ICD-10-CM | POA: Diagnosis not present

## 2020-10-04 DIAGNOSIS — R0789 Other chest pain: Secondary | ICD-10-CM | POA: Insufficient documentation

## 2020-10-04 DIAGNOSIS — M79602 Pain in left arm: Secondary | ICD-10-CM

## 2020-10-04 DIAGNOSIS — K219 Gastro-esophageal reflux disease without esophagitis: Secondary | ICD-10-CM | POA: Diagnosis not present

## 2020-10-04 LAB — BASIC METABOLIC PANEL
Anion gap: 9 (ref 5–15)
BUN: 20 mg/dL (ref 8–23)
CO2: 25 mmol/L (ref 22–32)
Calcium: 9 mg/dL (ref 8.9–10.3)
Chloride: 104 mmol/L (ref 98–111)
Creatinine, Ser: 1.07 mg/dL — ABNORMAL HIGH (ref 0.44–1.00)
GFR, Estimated: 58 mL/min — ABNORMAL LOW (ref >60)
Glucose, Bld: 113 mg/dL — ABNORMAL HIGH (ref 70–99)
Potassium: 4.1 mmol/L (ref 3.5–5.1)
Sodium: 138 mmol/L (ref 135–145)

## 2020-10-04 LAB — HEPATIC FUNCTION PANEL
ALT: 18 U/L (ref 0–44)
AST: 20 U/L (ref 15–41)
Albumin: 4.4 g/dL (ref 3.5–5.0)
Alkaline Phosphatase: 94 U/L (ref 38–126)
Bilirubin, Direct: 0.2 mg/dL (ref 0.0–0.2)
Indirect Bilirubin: 1.2 mg/dL — ABNORMAL HIGH (ref 0.3–0.9)
Total Bilirubin: 1.4 mg/dL — ABNORMAL HIGH (ref 0.3–1.2)
Total Protein: 7.8 g/dL (ref 6.5–8.1)

## 2020-10-04 LAB — CBC
HCT: 41.8 % (ref 36.0–46.0)
Hemoglobin: 14.6 g/dL (ref 12.0–15.0)
MCH: 31.8 pg (ref 26.0–34.0)
MCHC: 34.9 g/dL (ref 30.0–36.0)
MCV: 91.1 fL (ref 80.0–100.0)
Platelets: 264 10*3/uL (ref 150–400)
RBC: 4.59 MIL/uL (ref 3.87–5.11)
RDW: 12.6 % (ref 11.5–15.5)
WBC: 6.8 10*3/uL (ref 4.0–10.5)
nRBC: 0 % (ref 0.0–0.2)

## 2020-10-04 LAB — TROPONIN I (HIGH SENSITIVITY): Troponin I (High Sensitivity): 4 ng/L (ref <18)

## 2020-10-04 LAB — LIPASE, BLOOD: Lipase: 47 U/L (ref 11–51)

## 2020-10-04 MED ORDER — IOHEXOL 350 MG/ML SOLN
100.0000 mL | Freq: Once | INTRAVENOUS | Status: AC | PRN
  Administered 2020-10-04: 100 mL via INTRAVENOUS

## 2020-10-04 MED ORDER — LIDOCAINE 5 % EX PTCH: 1.0000 | MEDICATED_PATCH | Freq: Two times a day (BID) | CUTANEOUS | 0 refills | Status: AC

## 2020-10-04 MED ORDER — ACETAMINOPHEN 500 MG PO TABS
1000.0000 mg | ORAL_TABLET | Freq: Once | ORAL | Status: AC
  Administered 2020-10-04: 1000 mg via ORAL
  Filled 2020-10-04: qty 2

## 2020-10-04 MED ORDER — IBUPROFEN 400 MG PO TABS: 400.0000 mg | ORAL_TABLET | Freq: Three times a day (TID) | ORAL | 0 refills | Status: AC | PRN

## 2020-10-04 MED ORDER — LIDOCAINE 5 % EX PTCH
1.0000 | MEDICATED_PATCH | CUTANEOUS | Status: DC
  Administered 2020-10-04: 1 via TRANSDERMAL
  Filled 2020-10-04: qty 1

## 2020-10-04 NOTE — ED Provider Notes (Signed)
[facility] Emergency Department Provider Note  ____________________________________________   Event Date/Time   First MD Initiated Contact with Patient 10/04/20 [phone]     (approximate)  I have reviewed the triage vital signs and the nursing notes.   HISTORY  Chief Complaint Abdominal Pain    HPI Michele Melton is a 65 y.o. female with history of acid reflux who comes in with concern for chest pain.  Patient reports that she has had pain in the left side of her neck. patient reports that the symptoms at this time are moderate, constant, not better with ibuprofen taken once, worse at rest.  She states that she works at [employer] and denies straining anything.  She states that when she is exerting herself she does not have symptoms but at rest is when her symptoms get worse.  She states that occasionally the pain goes in the upper abdomen but mostly its in the left side of her neck and down her arm.  She denies any shortness of breath, pleuritic component of the pain.  Denies any swelling in her legs.  She denies ever having this previously.  Patient states that she is not been to the doctor in years.  Does not have a history of high blood pressure.  Patient reports to have been going on constantly for about a week but is getting more strong.          Past Medical History:  Diagnosis Date   GERD (gastroesophageal reflux disease)    PONV (postoperative nausea and vomiting)    after 1st knee surgery   Reflux     There are no problems to display for this patient.   Past Surgical History:  Procedure Laterality Date   CATARACT EXTRACTION W/PHACO Right 03/06/2018   Procedure: CATARACT EXTRACTION PHACO AND INTRAOCULAR LENS PLACEMENT (IOC) RIGHT;  Surgeon: Lockie Mola, MD;  Location: [facility];  Service: Ophthalmology;  Laterality: Right;   CATARACT EXTRACTION W/PHACO Left 03/20/2018   Procedure: CATARACT EXTRACTION PHACO AND INTRAOCULAR  LENS PLACEMENT (IOC)-LEFT;  Surgeon: Lockie Mola, MD;  Location: [facility];  Service: Ophthalmology;  Laterality: Left;  [place] 01:03 AP% 11.6 CDE 9.48 Fluid pack lot # [id number]   CESAREAN SECTION  1983   KNEE ARTHROSCOPY Left 05/11/2015   Procedure: ARTHROSCOPY KNEE, PARTIAL MEDIAL MENISECTOMY;  Surgeon: Kennedy Bucker, MD;  Location: [facility];  Service: Orthopedics;  Laterality: Left;   knne surgery for torn meniscus Right 2012 approx    Prior to Admission medications   Medication Sig Start Date End Date Taking? Authorizing Provider  ibuprofen (ADVIL,MOTRIN) 200 MG tablet Take 400 mg by mouth See admin instructions. Take 2 tablets (400 mg) by mouth daily in the morning & every 8 hours if needed for pain.    [provider]  lansoprazole (PREVACID) 15 MG capsule Take 15 mg by mouth daily.     [provider]  NON FORMULARY Place 1 drop into the right eye See admin instructions. Pred-Gati-Brom(Prednisolone Phosphate/Gatifloxacin/Bromfenac)  Instill 1 drop into right eye twice daily for 2 weeks (beginning 03/14/2018) Instill 1 drop into right eye at bedtime for 1 week (beginning 03/28/2018)    [provider]    Allergies Patient has no known allergies.  No family history on file.  Social History Social History   Tobacco Use   Smoking status: Never   Smokeless tobacco: Never  Vaping Use   Vaping Use: Never used  Substance Use Topics   Alcohol use:  Yes    Comment: rare - 1-2x/yr      Review of Systems Constitutional: No fever/chills Eyes: No visual changes. ENT: No sore throat. Cardiovascular: Chest pain Respiratory: Denies shortness of breath. Gastrointestinal: Abdominal pain Genitourinary: Negative for dysuria. Musculoskeletal: Negative for back pain. Skin: Negative for rash. Neurological: Negative for headaches, focal weakness or numbness. All other ROS negative ____________________________________________   PHYSICAL EXAM:  VITAL  SIGNS: ED Triage Vitals [10/04/20 0752]  Enc Vitals Group     BP      Pulse      Resp      Temp      Temp src      SpO2      Weight 175 lb (79.4 kg)     Height 5\' 5"  (1.651 m)     Head Circumference      Peak Flow      Pain Score 5     Pain Loc      Pain Edu?      Excl. in GC?     Constitutional: Alert and oriented. Well appearing and in no acute distress. Eyes: Conjunctivae are normal. EOMI. Head: Atraumatic. Nose: No congestion/rhinnorhea. Mouth/Throat: Mucous membranes are moist.   Neck: No stridor. Trachea Midline. FROM Cardiovascular: Normal rate, regular rhythm. Grossly normal heart sounds.  Good peripheral circulation. Respiratory: Normal respiratory effort.  No retractions. Lungs CTAB. Gastrointestinal: Mild tenderness up underneath the left breast.  No rash noted no distention. No abdominal bruits.  Musculoskeletal: No lower extremity tenderness nor edema.  No joint effusions. Neurologic:  Normal speech and language. No gross focal neurologic deficits are appreciated.  Cranial nerves II to XII are appear intact.  Equal strength in arms and legs.  No pronator drift. Skin:  Skin is warm, dry and intact. No rash noted. Psychiatric: Mood and affect are normal. Speech and behavior are normal. GU: Deferred   ____________________________________________   LABS (all labs ordered are listed, but only abnormal results are displayed)  Labs Reviewed  BASIC METABOLIC PANEL - Abnormal; Notable for the following components:      Result Value   Glucose, Bld 113 (*)    Creatinine, Ser 1.07 (*)    GFR, Estimated 58 (*)    All other components within normal limits  HEPATIC FUNCTION PANEL - Abnormal; Notable for the following components:   Total Bilirubin 1.4 (*)    Indirect Bilirubin 1.2 (*)    All other components within normal limits  CBC  LIPASE, BLOOD  TROPONIN I (HIGH SENSITIVITY)  TROPONIN I (HIGH SENSITIVITY)   ____________________________________________   ED  ECG REPORT I, , the attending physician, personally viewed and interpreted this ECG.  Normal sinus rate of 88, no ST elevation, no T wave inversions, normal intervals. ____________________________________________  RADIOLOGY   Official radiology report(s): DG Chest 2 View  Result Date: 10/04/2020 CLINICAL DATA:  65 year old female with chest and epigastric pain. Pain radiating to the left arm. EXAM: CHEST - 2 VIEW COMPARISON:  None. FINDINGS: Normal lung volumes and mediastinal contours. Visualized tracheal air column is within normal limits. Both lungs appear clear. No pneumothorax or pleural effusion. Mildly exaggerated thoracic kyphosis. No acute osseous abnormality identified. Negative visible bowel gas pattern. IMPRESSION: Negative.  No acute cardiopulmonary abnormality. Electronically Signed   By: 66 M.D.   On: 10/04/2020 08:46   CT HEAD WO CONTRAST (12/04/2020)  Result Date: 10/04/2020 CLINICAL DATA:  Neuro deficit, acute, stroke suspected left arm heaviness EXAM: CT HEAD  WITHOUT CONTRAST TECHNIQUE: Contiguous axial images were obtained from the base of the skull through the vertex without intravenous contrast. COMPARISON:  None. FINDINGS: Brain: No acute intracranial abnormality. Specifically, no hemorrhage, hydrocephalus, mass lesion, acute infarction, or significant intracranial injury. Vascular: No hyperdense vessel or unexpected calcification. Skull: No acute calvarial abnormality. Sinuses/Orbits: No acute findings Other: None IMPRESSION: No acute intracranial abnormality. Electronically Signed   By: Charlett NoseKevin  Dover M.D.   On: 10/04/2020 09:48   CT Cervical Spine Wo Contrast  Result Date: 10/04/2020 CLINICAL DATA:  Left arm pain, heaviness EXAM: CT CERVICAL SPINE WITHOUT CONTRAST TECHNIQUE: Multidetector CT imaging of the cervical spine was performed without intravenous contrast. Multiplanar CT image reconstructions were also generated. COMPARISON:  None. FINDINGS: Alignment:  Normal Skull base and vertebrae: No acute fracture. No primary bone lesion or focal pathologic process. Soft tissues and spinal canal: No prevertebral fluid or swelling. No visible canal hematoma. Disc levels: Degenerative disc disease changes with disc space narrowing and anterior spurring. Early bilateral degenerative facet disease. Mild left neural foraminal narrowing at C5-6 due to uncovertebral spurring. Upper chest: No acute findings Other: None IMPRESSION: Degenerative disc and facet disease as above. Mild left neural foraminal narrowing at C5-6. No acute bony abnormality. Electronically Signed   By: Charlett NoseKevin  Dover M.D.   On: 10/04/2020 09:47   CT Angio Chest/Abd/Pel for Dissection W and/or Wo Contrast  Result Date: 10/04/2020 CLINICAL DATA:  Abdominal pain, aortic dissection suspected EXAM: CT ANGIOGRAPHY CHEST, ABDOMEN AND PELVIS TECHNIQUE: Non-contrast CT of the chest was initially obtained. Multidetector CT imaging through the chest, abdomen and pelvis was performed using the standard protocol during bolus administration of intravenous contrast. Multiplanar reconstructed images and MIPs were obtained and reviewed to evaluate the vascular anatomy. CONTRAST:  100mL OMNIPAQUE IOHEXOL 350 MG/ML SOLN COMPARISON:  None. FINDINGS: CTA CHEST FINDINGS Cardiovascular: Heart is normal size. Scattered aortic calcifications. No aneurysm or dissection. No filling defect in the central pulmonary arteries to suggest large pulmonary embolus. Scattered calcifications in the left anterior descending coronary artery. Mediastinum/Nodes: Moderate-sized hiatal hernia. No mediastinal, hilar, or axillary adenopathy. Trachea and thyroid unremarkable. Lungs/Pleura: Linear atelectasis or scarring in the lung bases. No confluent opacities or effusions. Musculoskeletal: Chest wall soft tissues are unremarkable. No acute bony abnormality. Review of the MIP images confirms the above findings. CTA ABDOMEN AND PELVIS FINDINGS VASCULAR  Aorta: Aortic atherosclerosis.  No aneurysm or dissection. Celiac: Patent without evidence of aneurysm, dissection, vasculitis or significant stenosis. SMA: Patent without evidence of aneurysm, dissection, vasculitis or significant stenosis. Renals: Patent without evidence of aneurysm, dissection, vasculitis, fibromuscular dysplasia or significant stenosis. Small accessory branches to the lower lobes bilaterally. IMA: Patent without evidence of aneurysm, dissection, vasculitis or significant stenosis. Inflow: Patent without evidence of aneurysm, dissection, vasculitis or significant stenosis. Veins: No obvious venous abnormality within the limitations of this arterial phase study. Review of the MIP images confirms the above findings. NON-VASCULAR Hepatobiliary: No focal hepatic abnormality. Gallbladder unremarkable. Pancreas: No focal abnormality or ductal dilatation. Spleen: No focal abnormality.  Normal size. Adrenals/Urinary Tract: 7 mm stone in the left renal pelvis. No ureteral stone or hydronephrosis bilaterally. Adrenal glands and urinary bladder unremarkable. Stomach/Bowel: Sigmoid diverticulosis. No active diverticulitis. Stomach and small bowel decompressed, unremarkable. Lymphatic: No adenopathy Reproductive: Uterus and adnexa unremarkable.  No mass. Other: No free fluid or free air. Musculoskeletal: No acute bony abnormality. Review of the MIP images confirms the above findings. IMPRESSION: Scattered aortic atherosclerosis.  No aneurysm or dissection. Scattered coronary artery  calcifications in the left anterior descending coronary artery. No acute cardiopulmonary disease. Sigmoid diverticulosis.  No active diverticulitis. Left nephrolithiasis. No acute findings in the abdomen or pelvis. Electronically Signed   By: Charlett Nose M.D.   On: 10/04/2020 09:46    ____________________________________________   PROCEDURES  Procedure(s) performed (including Critical Care):  .1-3 Lead EKG  Interpretation Performed by: Concha Se, MD Authorized by: Concha Se, MD     Interpretation: normal     ECG rate:  70s   ECG rate assessment: normal     Rhythm: sinus rhythm     Ectopy: none     Conduction: normal     ____________________________________________   INITIAL IMPRESSION / ASSESSMENT AND PLAN / ED COURSE  Michele Melton was evaluated in Emergency Department on 10/04/2020 for the symptoms described in the history of present illness. She was evaluated in the context of the global COVID-19 pandemic, which necessitated consideration that the patient might be at risk for infection with the SARS-CoV-2 virus that causes COVID-19. Institutional protocols and algorithms that pertain to the evaluation of patients at risk for COVID-19 are in a state of rapid change based on information released by regulatory bodies including the CDC and federal and state organizations. These policies and algorithms were followed during the patient's care in the ED.    Patient comes in with left-sided neck pain, arm pain that radiates into her chest and occasionally down into her abdomen.  Patient states that her arm feels heavy but on my exam she is got good strength in it there is no pronator drift and her neuro exam is intact.  I have low suspicion for intercranial hemorrhage but given this been going on for a week we will get CT head to make sure no evidence of stroke, bleed.  Given the pain seems to be starting from the neck although she denies any trauma I wonder if she has like a pinched nerve or something we will get a CT cervical to further evaluate.  There is no suspicion for cord compression.  Given the pain does radiate from her chest into her abdomen will get CT scan to make sure no evidence of dissection.  We will get EKG and cardiac monitors and keep patient the cardiac monitor to evaluate for ACS.   no swelling in the arm to suggest blood clot.  Good distal pulse unlikely arterial issue.     Patient's work-up is reassuring.  Troponin was negative but patient symptoms have been going on for greater than 3 hours therefore do not need to repeat.  I did discuss with patient though that given her abnormal CT imaging with Lad calcifications that she could have a risk for having a future heart attack that if she develops different pain in her chest that she needs to return for repeat evaluation.  She also understands that I given her the number for cardiology to get a further work-up.  IMPRESSION: Degenerative disc and facet disease as above. Mild left neural foraminal narrowing at C5-6  Her CT head was negative however her CT cervical did show some degenerative disc disease and mild left foraminal narrowing at C5-C6.  And on my repeat assessment this is approximately where patient's pain is.  She continues to have no weakness upon my assessment.  No numbness or tingling.  Good distal pulses.  Unlikely cord compression.  She had a lot of CT incidental findings on her CT chest abdomen pelvis and I did discuss  with patient.  Patient feeling better after medications.    We also discussed her slightly elevated T bili and so the elevated kidney function.  We discussed a course of ibuprofen, Tylenol, lidocaine patches and conservative management and following up with her primary care doctor if symptoms or not getting better for further treatment for this    ____________________________________________   FINAL CLINICAL IMPRESSION(S) / ED DIAGNOSES   Final diagnoses:  Pinched nerve  Pain of left upper extremity      MEDICATIONS GIVEN DURING THIS VISIT:  Medications  lidocaine (LIDODERM) 5 % 1 patch (1 patch Transdermal Patch Applied 10/04/20 0840)  acetaminophen (TYLENOL) tablet 1,000 mg (1,000 mg Oral Given 10/04/20 0839)  iohexol (OMNIPAQUE) 350 MG/ML injection 100 mL (100 mLs Intravenous Contrast Given 10/04/20 0925)     ED Discharge Orders          Ordered    lidocaine  (LIDODERM) 5 %  Every 12 hours        10/04/20 1035    ibuprofen (ADVIL) 400 MG tablet  Every 8 hours PRN        10/04/20 1035             Note:  This document was prepared using Dragon voice recognition software and may include unintentional dictation errors.    Concha Se, MD 10/04/20 1038

## 2020-10-04 NOTE — Discharge Instructions (Addendum)
Take Tylenol 1 g every 8 hours and ibuprofen 400 every 6-8 hours with food.  Your kidney function is slightly elevated so I am starting you on a slightly reduced dose of the ibuprofen to see if that will help your symptoms.  I would recommend trying conservative management first.  Also prescribe you some lidocaine patches to place along your left neck.  Your CT scan is as below .  I given you the number for neurosurgery to follow-up with if you continue to have significant pain as well as with cardiology for your abnormal CT scan.  Return to the ER if you develop weakness in the arm, worsening symptoms or any other concerns.  Follow-up with PCP for slightly elevated liver function, kidney function and elevated blood pressure.    IMPRESSION: Degenerative disc and facet disease as above. Mild left neural foraminal narrowing at C5-6.  IMPRESSION: Scattered aortic atherosclerosis.  No aneurysm or dissection.   Scattered coronary artery calcifications in the left anterior descending coronary artery.   No acute cardiopulmonary disease.   Sigmoid diverticulosis.  No active diverticulitis.   Left nephrolithiasis.   No acute findings in the abdomen or pelvis.

## 2020-10-04 NOTE — ED Triage Notes (Signed)
Pt to ED for epigastric pain and pain under left arm, heaviness to left arm for past few days.

## 2020-10-04 NOTE — ED Notes (Signed)
Pt transported to xray 

## 2023-04-18 IMAGING — CT CT ANGIO CHEST-ABD-PELV FOR DISSECTION W/ AND WO/W CM
2 of 7 series · 14 of 46 positions shown, 16 images · IV contrast (APPLIED)
Comparison: None.

CLINICAL DATA: Abdominal pain, aortic dissection suspected

EXAM:
CT ANGIOGRAPHY CHEST, ABDOMEN AND PELVIS
TECHNIQUE: Non-contrast CT of the chest was initially obtained.

[Series 5: axial arterial · axial · arterial · 0.70mm/px · z∈[-844,-298]mm · 11 of 210 slices shown, 13 images]
[im 14/210  soft-tissue]
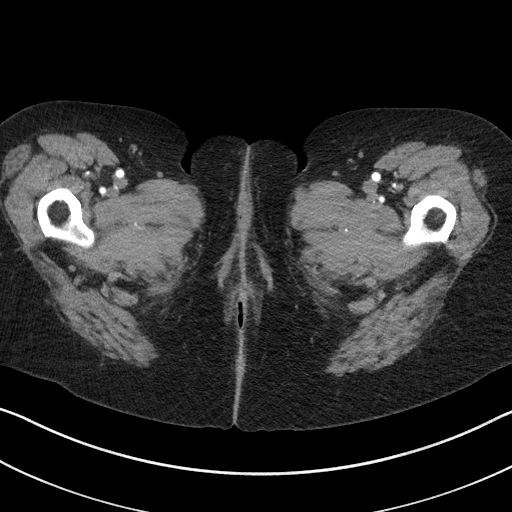
[im 14/210  bone]
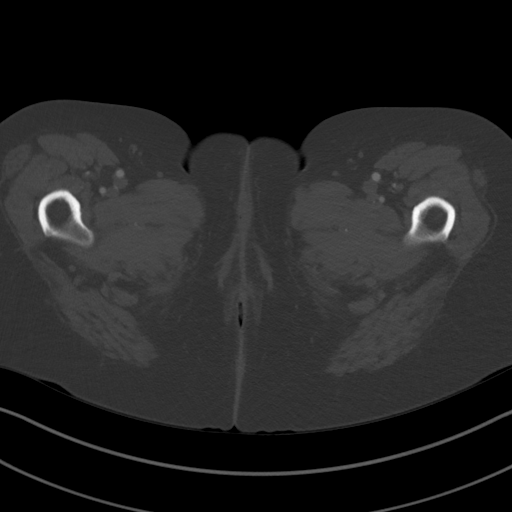
[im 28/210  soft-tissue]
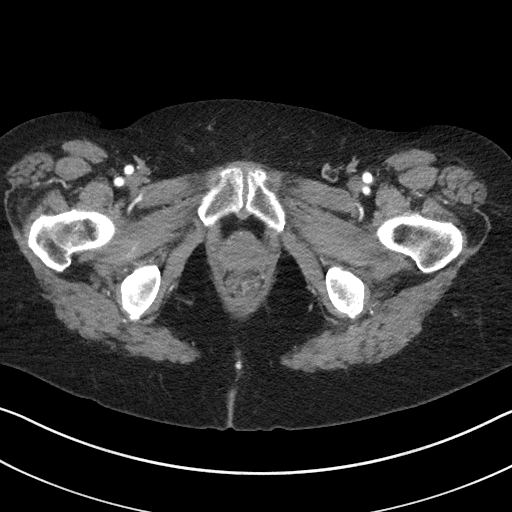
[im 56/210  soft-tissue]
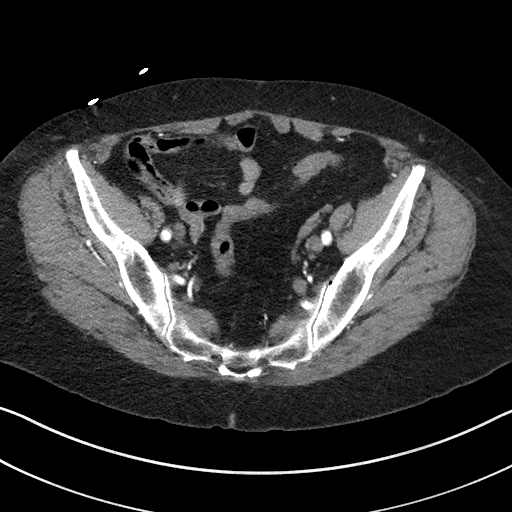
[im 70/210  soft-tissue]
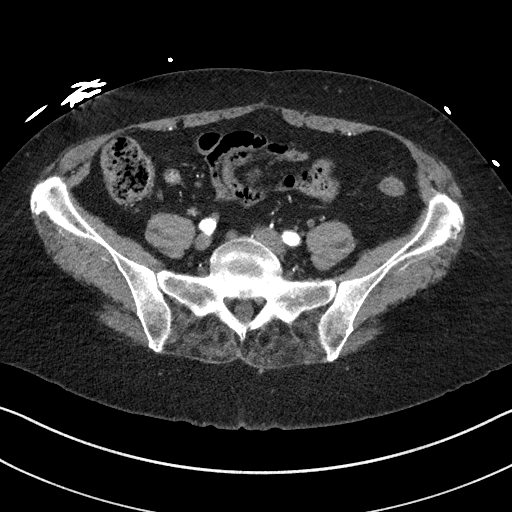
[im 84/210  soft-tissue]
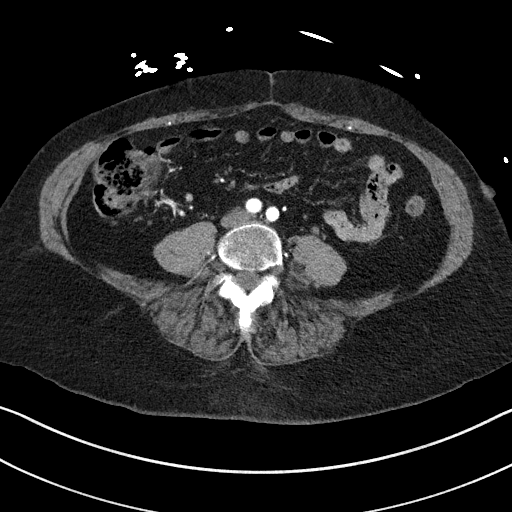
[im 112/210  soft-tissue]
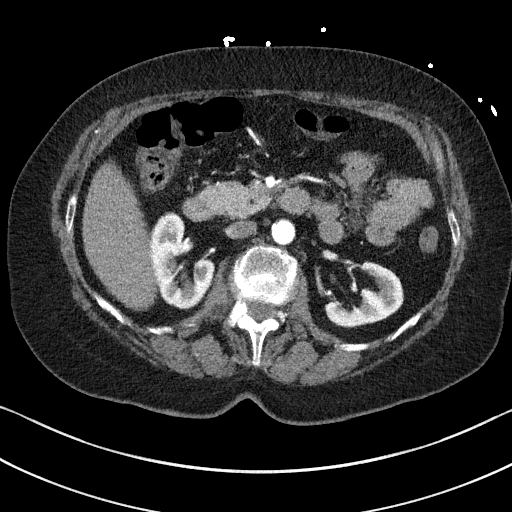
[im 126/210  soft-tissue]
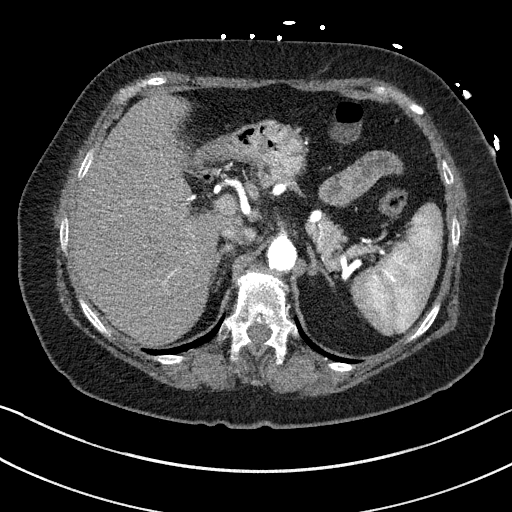
[im 140/210  soft-tissue]
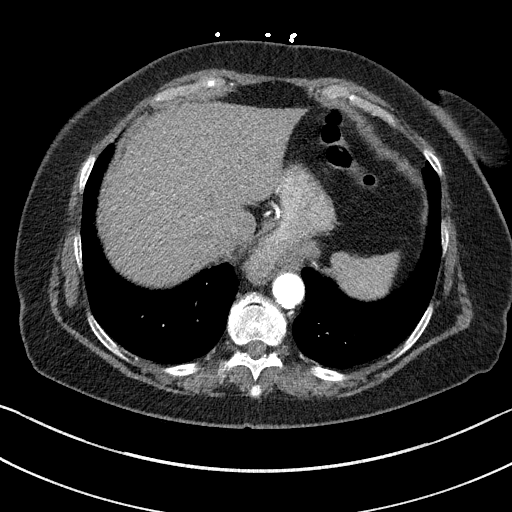
[im 154/210  soft-tissue]
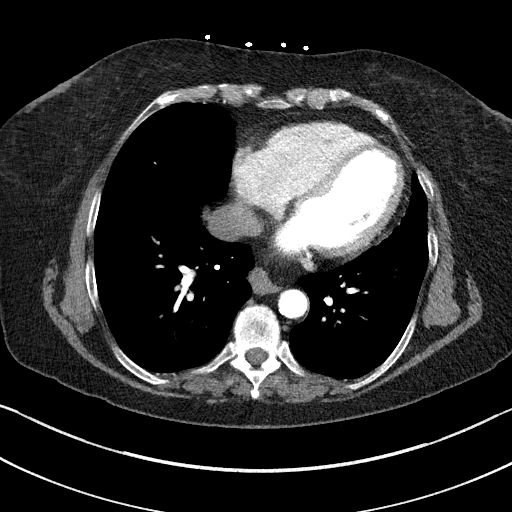
[im 154/210  bone]
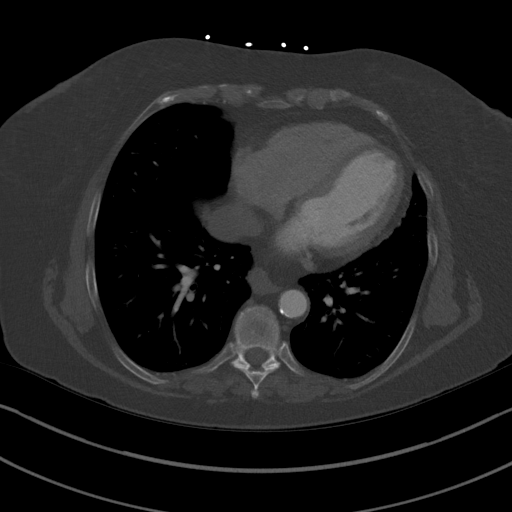
[im 182/210  soft-tissue]
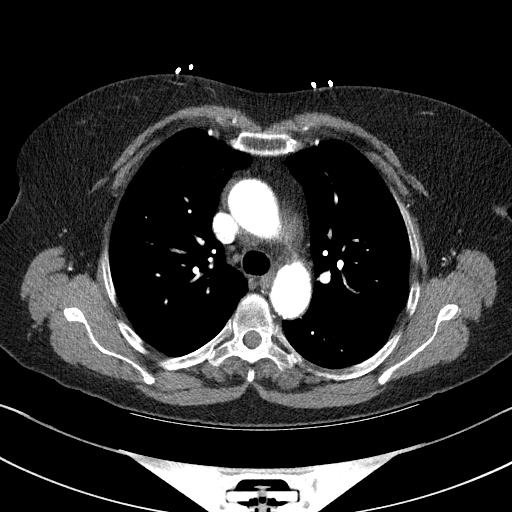
[im 196/210  soft-tissue]
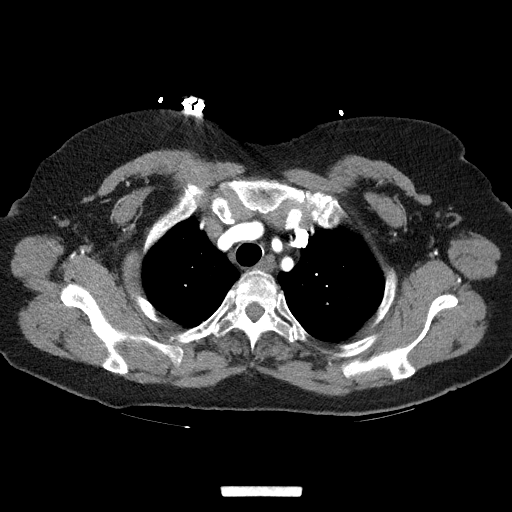

[Series 8: coronals · coronal · 0.86mm/px · 3 of 142 slices shown]
[im 36/142  soft-tissue]
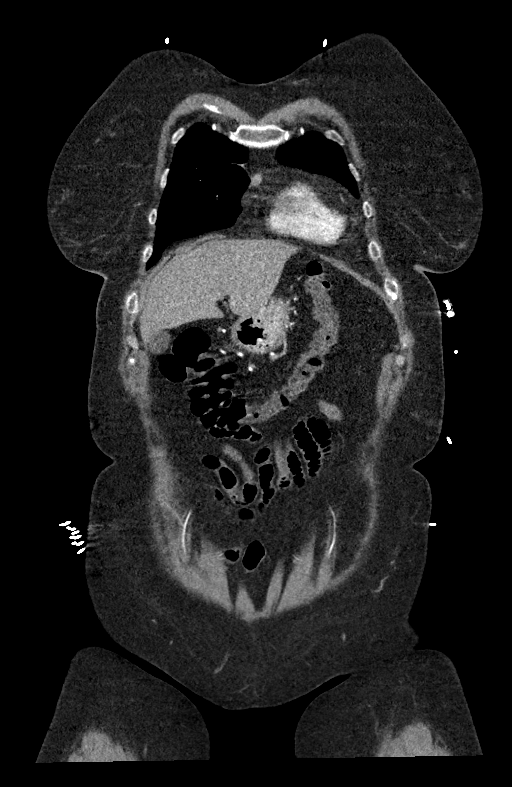
[im 71/142  soft-tissue]
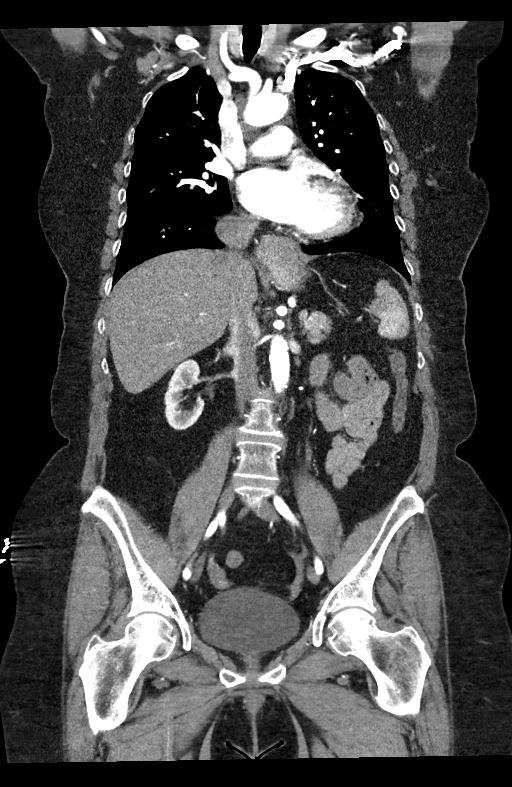
[im 106/142  soft-tissue]
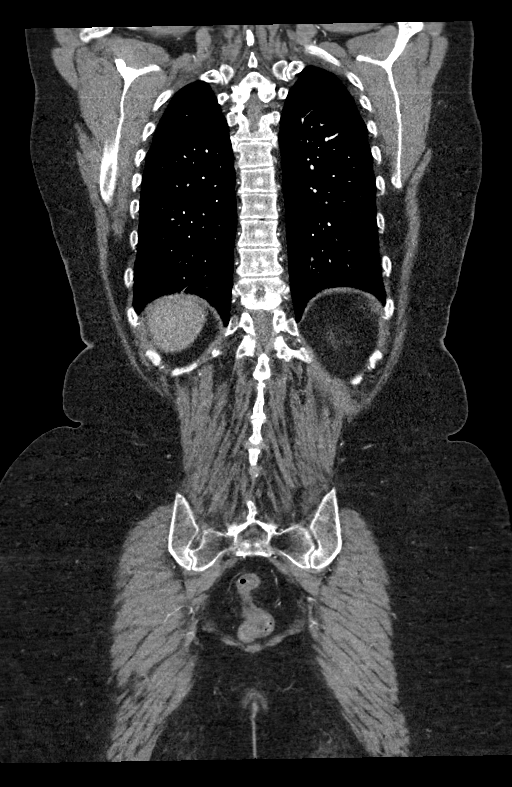

[14 of 46 positions shown; findings below may reference images not displayed]

Multidetector CT imaging through the chest, abdomen and pelvis was
performed using the standard protocol during bolus administration of
intravenous contrast. Multiplanar reconstructed images and MIPs were
obtained and reviewed to evaluate the vascular anatomy.

CONTRAST:  100mL OMNIPAQUE IOHEXOL 350 MG/ML SOLN
FINDINGS: CTA CHEST FINDINGS

Cardiovascular: Heart is normal size. Scattered aortic
calcifications. No aneurysm or dissection. No filling defect in the
central pulmonary arteries to suggest large pulmonary embolus.
Scattered calcifications in the left anterior descending coronary
artery.

Mediastinum/Nodes: Moderate-sized hiatal hernia. No mediastinal,
hilar, or axillary adenopathy. Trachea and thyroid unremarkable.

Lungs/Pleura: Linear atelectasis or scarring in the lung bases. No
confluent opacities or effusions.

Musculoskeletal: Chest wall soft tissues are unremarkable. No acute
bony abnormality.

Review of the MIP images confirms the above findings.

CTA ABDOMEN AND PELVIS FINDINGS

VASCULAR

Aorta: Aortic atherosclerosis.  No aneurysm or dissection.

Celiac: Patent without evidence of aneurysm, dissection, vasculitis
or significant stenosis.

SMA: Patent without evidence of aneurysm, dissection, vasculitis or
significant stenosis.

Renals: Patent without evidence of aneurysm, dissection, vasculitis,
fibromuscular dysplasia or significant stenosis. Small accessory
branches to the lower lobes bilaterally.

IMA: Patent without evidence of aneurysm, dissection, vasculitis or
significant stenosis.

Inflow: Patent without evidence of aneurysm, dissection, vasculitis
or significant stenosis.

Veins: No obvious venous abnormality within the limitations of this
arterial phase study.

Review of the MIP images confirms the above findings.

NON-VASCULAR

Hepatobiliary: No focal hepatic abnormality. Gallbladder
unremarkable.

Pancreas: No focal abnormality or ductal dilatation.

Spleen: No focal abnormality.  Normal size.

Adrenals/Urinary Tract: 7 mm stone in the left renal pelvis. No
ureteral stone or hydronephrosis bilaterally. Adrenal glands and
urinary bladder unremarkable.

Stomach/Bowel: Sigmoid diverticulosis. No active diverticulitis.
Stomach and small bowel decompressed, unremarkable.

Lymphatic: No adenopathy

Reproductive: Uterus and adnexa unremarkable.  No mass.

Other: No free fluid or free air.

Musculoskeletal: No acute bony abnormality.

Review of the MIP images confirms the above findings.
IMPRESSION: Scattered aortic atherosclerosis.  No aneurysm or dissection.

Scattered coronary artery calcifications in the left anterior
descending coronary artery.

No acute cardiopulmonary disease.

Sigmoid diverticulosis.  No active diverticulitis.

Left nephrolithiasis.

No acute findings in the abdomen or pelvis.

## 2023-04-18 IMAGING — CT CT CERVICAL SPINE W/O CM
3 of 4 series · 10 of 33 positions shown, 11 images · non-contrast
Comparison: None.

CLINICAL DATA: Left arm pain, heaviness

EXAM:
CT CERVICAL SPINE WITHOUT CONTRAST
TECHNIQUE: Multidetector CT imaging of the cervical spine was performed without
intravenous contrast. Multiplanar CT image reconstructions were also
generated.

[Series 6: sagittal bone · sagittal · 0.21mm/px · 5 of 57 slices shown]
[im 19/57  bone]
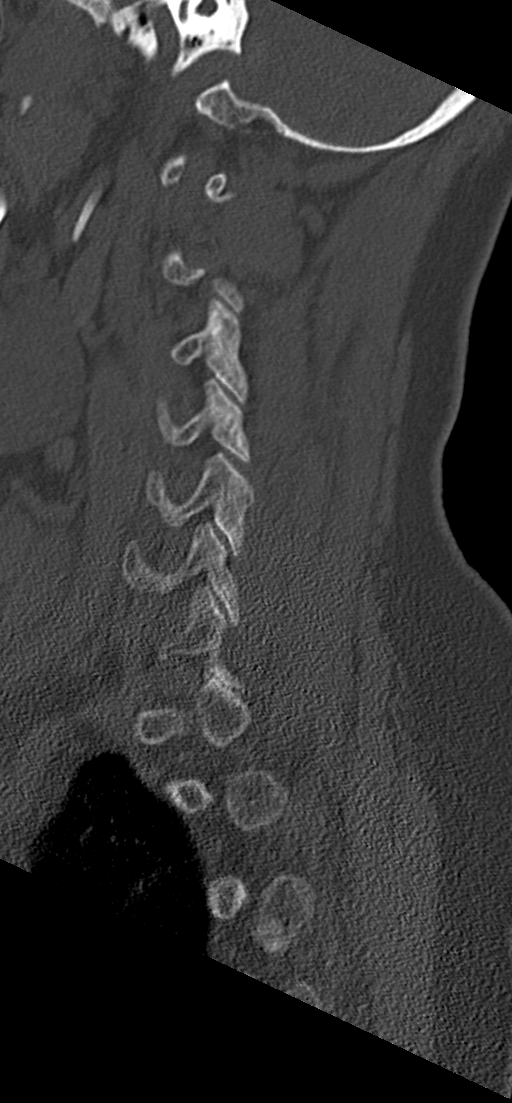
[im 24/57  bone]
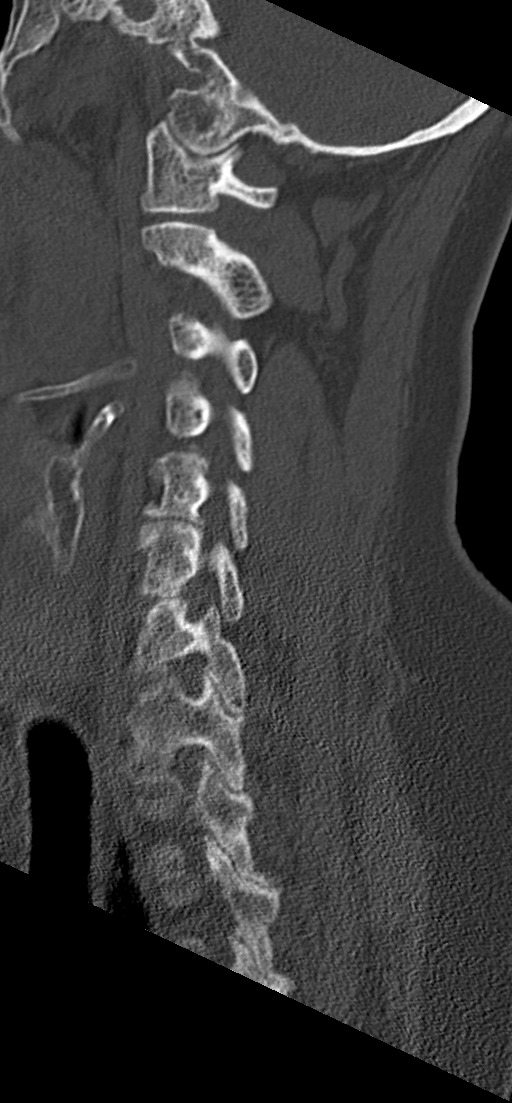
[im 29/57  bone]
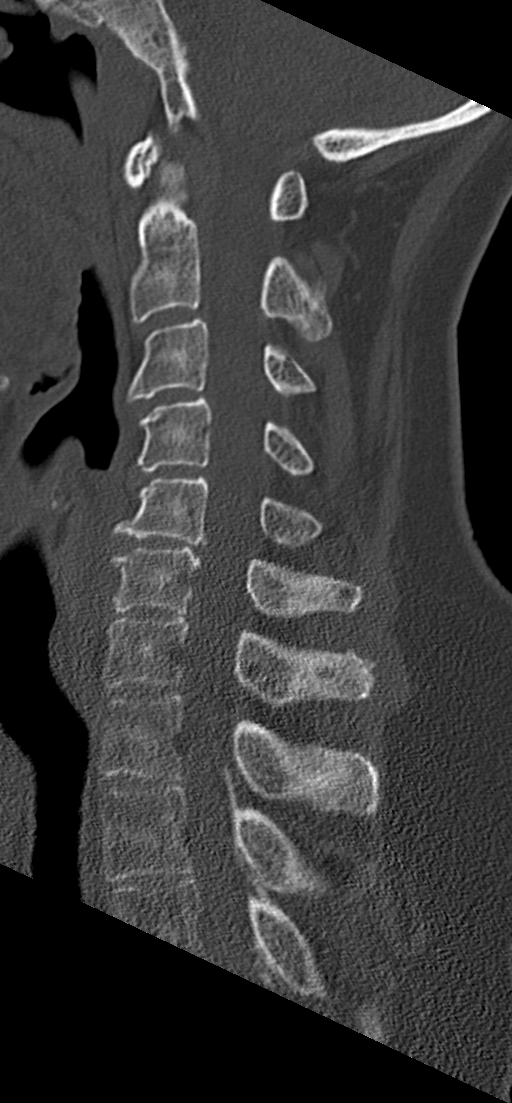
[im 33/57  bone]
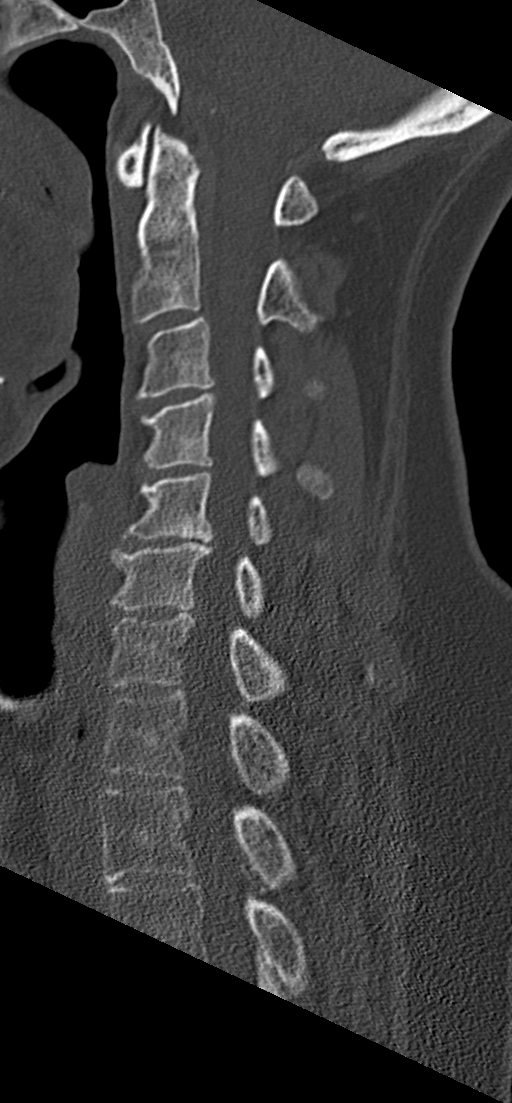
[im 38/57  bone]
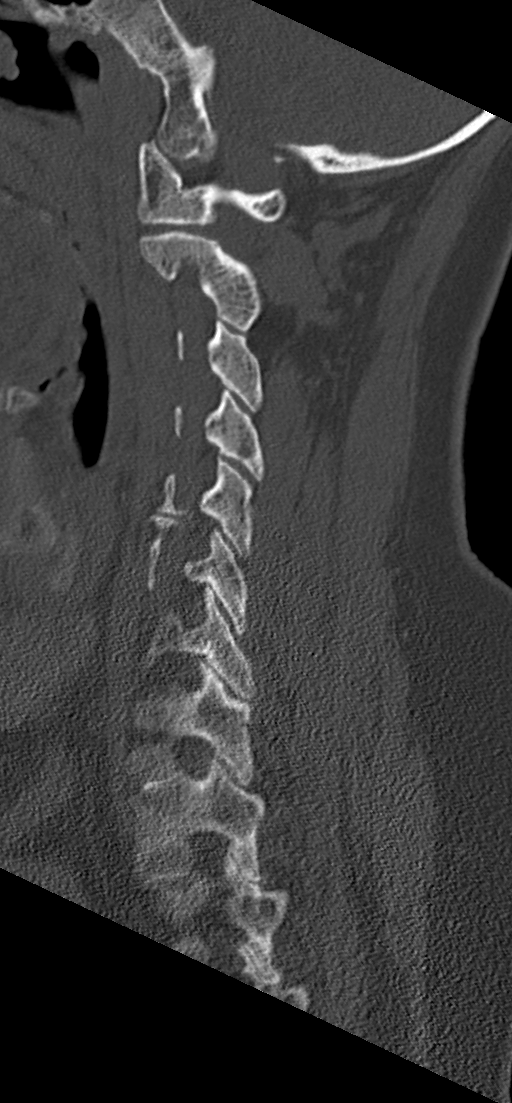

[Series 7: coronal bone · coronal · 0.24mm/px · 3 of 56 slices shown]
[im 12/56  bone]
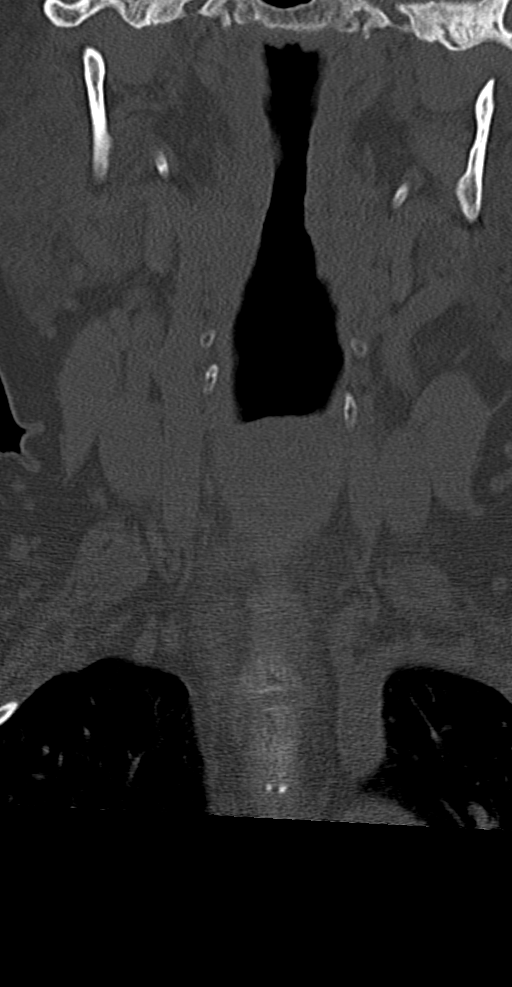
[im 23/56  bone]
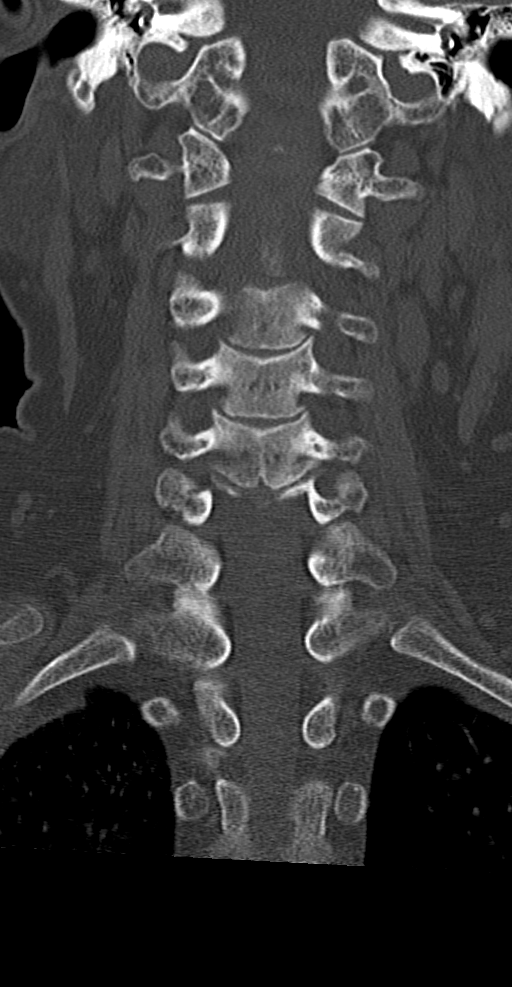
[im 34/56  bone]
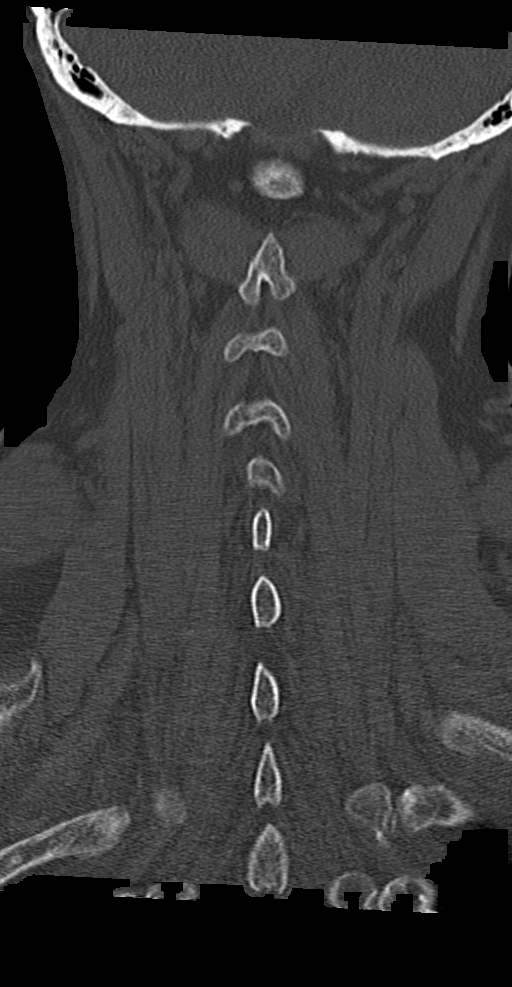

[Series 8: orthogonal bone · axial · 0.21mm/px · z∈[-297,-204]mm · 2 of 119 slices shown, 3 images]
[im 34/119  soft-tissue]
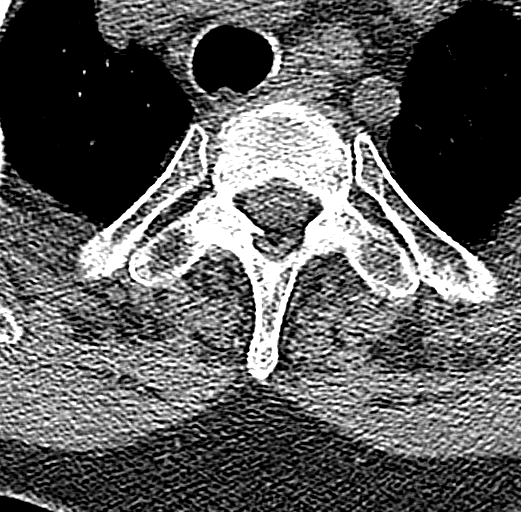
[im 34/119  bone]
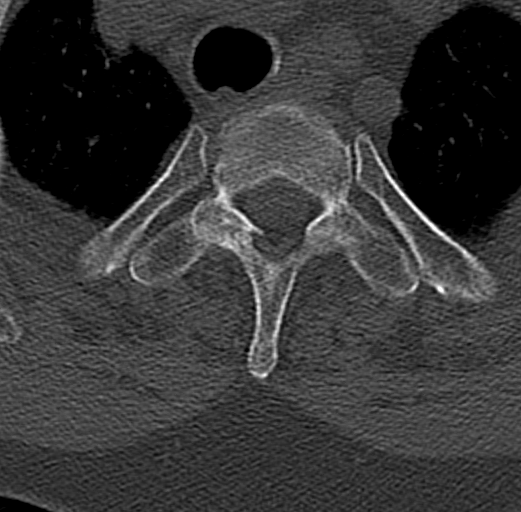
[im 85/119  bone]
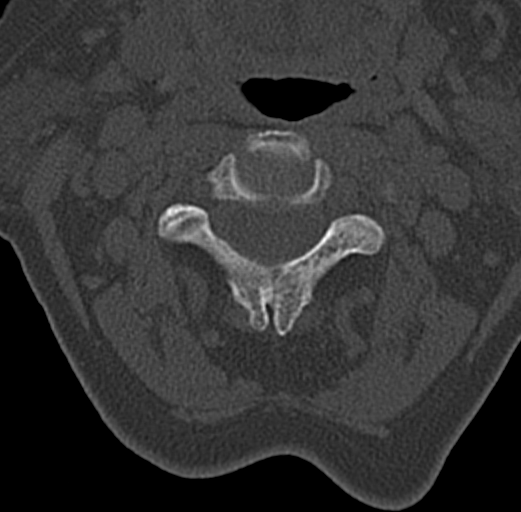

[10 of 33 positions shown; findings below may reference images not displayed]

FINDINGS: Alignment: Normal

Skull base and vertebrae: No acute fracture. No primary bone lesion
or focal pathologic process.

Soft tissues and spinal canal: No prevertebral fluid or swelling. No
visible canal hematoma.

Disc levels: Degenerative disc disease changes with disc space
narrowing and anterior spurring. Early bilateral degenerative facet
disease. Mild left neural foraminal narrowing at C5-6 due to
uncovertebral spurring.

Upper chest: No acute findings

Other: None
IMPRESSION: Degenerative disc and facet disease as above. Mild left neural
foraminal narrowing at C5-6.

No acute bony abnormality.
